# Patient Record
Sex: Female | Born: 1935 | Race: White | Hispanic: No | Smoking: Never smoker
Health system: Southern US, Community
[De-identification: ages and names within clinical notes are randomized; demographics above are authoritative.]

## PROBLEM LIST (undated history)

## (undated) DIAGNOSIS — Z5189 Encounter for other specified aftercare: Secondary | ICD-10-CM

## (undated) DIAGNOSIS — J42 Unspecified chronic bronchitis: Secondary | ICD-10-CM

## (undated) DIAGNOSIS — IMO0001 Reserved for inherently not codable concepts without codable children: Secondary | ICD-10-CM

## (undated) DIAGNOSIS — C801 Malignant (primary) neoplasm, unspecified: Secondary | ICD-10-CM

## (undated) DIAGNOSIS — J449 Chronic obstructive pulmonary disease, unspecified: Secondary | ICD-10-CM

## (undated) DIAGNOSIS — I712 Thoracic aortic aneurysm, without rupture: Secondary | ICD-10-CM

## (undated) DIAGNOSIS — M1990 Unspecified osteoarthritis, unspecified site: Secondary | ICD-10-CM

## (undated) DIAGNOSIS — I10 Essential (primary) hypertension: Secondary | ICD-10-CM

## (undated) DIAGNOSIS — N189 Chronic kidney disease, unspecified: Secondary | ICD-10-CM

## (undated) DIAGNOSIS — I7121 Aneurysm of the ascending aorta, without rupture: Secondary | ICD-10-CM

## (undated) DIAGNOSIS — I719 Aortic aneurysm of unspecified site, without rupture: Secondary | ICD-10-CM

## (undated) DIAGNOSIS — D649 Anemia, unspecified: Secondary | ICD-10-CM

## (undated) DIAGNOSIS — I729 Aneurysm of unspecified site: Secondary | ICD-10-CM

## (undated) DIAGNOSIS — K5903 Drug induced constipation: Secondary | ICD-10-CM

## (undated) HISTORY — PX: BLADDER SURGERY: SHX569

## (undated) HISTORY — PX: JOINT REPLACEMENT: SHX530

## (undated) HISTORY — PX: EYE SURGERY: SHX253

## (undated) HISTORY — DX: Encounter for other specified aftercare: Z51.89

## (undated) HISTORY — PX: TUBAL LIGATION: SHX77

## (undated) HISTORY — PX: APPENDECTOMY: SHX54

## (undated) HISTORY — PX: OTHER SURGICAL HISTORY: SHX169

## (undated) HISTORY — DX: Chronic kidney disease, unspecified: N18.9

## (undated) HISTORY — DX: Malignant (primary) neoplasm, unspecified: C80.1

## (undated) HISTORY — PX: ABDOMINAL HYSTERECTOMY: SHX81

## (undated) HISTORY — PX: MASTECTOMY: SHX3

## (undated) HISTORY — DX: Aneurysm of the ascending aorta, without rupture: I71.21

## (undated) HISTORY — PX: KNEE ARTHROSCOPY: SUR90

## (undated) HISTORY — DX: Thoracic aortic aneurysm, without rupture: I71.2

## (undated) HISTORY — PX: BREAST SURGERY: SHX581

---

## 1998-03-12 ENCOUNTER — Ambulatory Visit (HOSPITAL_COMMUNITY): Admission: RE | Admit: 1998-03-12 | Discharge: 1998-03-12 | Payer: Self-pay | Admitting: *Deleted

## 1998-03-12 ENCOUNTER — Encounter: Payer: Self-pay | Admitting: *Deleted

## 1998-03-28 ENCOUNTER — Encounter: Payer: Self-pay | Admitting: *Deleted

## 1998-03-29 ENCOUNTER — Inpatient Hospital Stay (HOSPITAL_COMMUNITY): Admission: RE | Admit: 1998-03-29 | Discharge: 1998-03-30 | Payer: Self-pay | Admitting: *Deleted

## 1999-04-06 ENCOUNTER — Encounter: Admission: RE | Admit: 1999-04-06 | Discharge: 1999-04-06 | Payer: Self-pay | Admitting: *Deleted

## 1999-04-06 ENCOUNTER — Encounter: Payer: Self-pay | Admitting: *Deleted

## 1999-07-21 ENCOUNTER — Encounter: Admission: RE | Admit: 1999-07-21 | Discharge: 1999-07-21 | Payer: Self-pay | Admitting: Hematology and Oncology

## 1999-07-21 ENCOUNTER — Encounter: Payer: Self-pay | Admitting: Hematology and Oncology

## 2000-04-11 ENCOUNTER — Encounter: Payer: Self-pay | Admitting: Hematology and Oncology

## 2000-04-11 ENCOUNTER — Encounter: Admission: RE | Admit: 2000-04-11 | Discharge: 2000-04-11 | Payer: Self-pay | Admitting: Hematology and Oncology

## 2000-08-24 ENCOUNTER — Encounter: Payer: Self-pay | Admitting: Hematology and Oncology

## 2000-08-24 ENCOUNTER — Ambulatory Visit (HOSPITAL_COMMUNITY): Admission: RE | Admit: 2000-08-24 | Discharge: 2000-08-24 | Payer: Self-pay | Admitting: Hematology and Oncology

## 2001-01-12 ENCOUNTER — Other Ambulatory Visit: Admission: RE | Admit: 2001-01-12 | Discharge: 2001-01-12 | Payer: Self-pay | Admitting: Obstetrics and Gynecology

## 2001-03-16 ENCOUNTER — Encounter: Admission: RE | Admit: 2001-03-16 | Discharge: 2001-03-16 | Payer: Self-pay | Admitting: Hematology and Oncology

## 2001-03-16 ENCOUNTER — Encounter: Payer: Self-pay | Admitting: Hematology and Oncology

## 2001-08-31 ENCOUNTER — Ambulatory Visit (HOSPITAL_COMMUNITY): Admission: RE | Admit: 2001-08-31 | Discharge: 2001-08-31 | Payer: Self-pay | Admitting: Hematology and Oncology

## 2001-08-31 ENCOUNTER — Encounter: Payer: Self-pay | Admitting: Hematology and Oncology

## 2002-03-16 ENCOUNTER — Encounter: Admission: RE | Admit: 2002-03-16 | Discharge: 2002-03-16 | Payer: Self-pay | Admitting: Obstetrics and Gynecology

## 2002-03-16 ENCOUNTER — Encounter: Payer: Self-pay | Admitting: Obstetrics and Gynecology

## 2003-03-18 ENCOUNTER — Encounter: Admission: RE | Admit: 2003-03-18 | Discharge: 2003-03-18 | Payer: Self-pay | Admitting: Oncology

## 2004-01-01 ENCOUNTER — Ambulatory Visit: Payer: Self-pay | Admitting: Family Medicine

## 2004-03-27 ENCOUNTER — Encounter: Admission: RE | Admit: 2004-03-27 | Discharge: 2004-03-27 | Payer: Self-pay | Admitting: Oncology

## 2004-05-08 ENCOUNTER — Ambulatory Visit: Payer: Self-pay | Admitting: Oncology

## 2004-07-02 ENCOUNTER — Ambulatory Visit: Payer: Self-pay | Admitting: Family Medicine

## 2004-07-15 ENCOUNTER — Encounter (INDEPENDENT_AMBULATORY_CARE_PROVIDER_SITE_OTHER): Payer: Self-pay | Admitting: Specialist

## 2004-07-15 ENCOUNTER — Inpatient Hospital Stay (HOSPITAL_COMMUNITY): Admission: RE | Admit: 2004-07-15 | Discharge: 2004-07-17 | Payer: Self-pay | Admitting: Obstetrics and Gynecology

## 2004-10-07 ENCOUNTER — Ambulatory Visit: Payer: Self-pay | Admitting: Family Medicine

## 2004-11-05 ENCOUNTER — Ambulatory Visit: Payer: Self-pay | Admitting: Family Medicine

## 2005-02-08 ENCOUNTER — Ambulatory Visit: Payer: Self-pay | Admitting: Family Medicine

## 2005-03-29 ENCOUNTER — Encounter: Admission: RE | Admit: 2005-03-29 | Discharge: 2005-03-29 | Payer: Self-pay | Admitting: Oncology

## 2005-05-28 ENCOUNTER — Ambulatory Visit: Payer: Self-pay | Admitting: Oncology

## 2006-01-05 ENCOUNTER — Ambulatory Visit: Payer: Self-pay | Admitting: Family Medicine

## 2006-03-31 ENCOUNTER — Encounter: Admission: RE | Admit: 2006-03-31 | Discharge: 2006-03-31 | Payer: Self-pay | Admitting: Oncology

## 2006-05-18 ENCOUNTER — Ambulatory Visit: Payer: Self-pay | Admitting: Oncology

## 2007-04-18 ENCOUNTER — Encounter: Admission: RE | Admit: 2007-04-18 | Discharge: 2007-04-18 | Payer: Self-pay | Admitting: Obstetrics and Gynecology

## 2008-04-18 ENCOUNTER — Encounter: Admission: RE | Admit: 2008-04-18 | Discharge: 2008-04-18 | Payer: Self-pay | Admitting: Obstetrics and Gynecology

## 2009-04-21 ENCOUNTER — Ambulatory Visit (HOSPITAL_COMMUNITY): Admission: RE | Admit: 2009-04-21 | Discharge: 2009-04-21 | Payer: Self-pay | Admitting: Obstetrics and Gynecology

## 2009-05-26 ENCOUNTER — Encounter: Payer: Self-pay | Admitting: Pulmonary Disease

## 2009-07-08 DIAGNOSIS — I10 Essential (primary) hypertension: Secondary | ICD-10-CM | POA: Insufficient documentation

## 2009-07-09 ENCOUNTER — Ambulatory Visit: Payer: Self-pay | Admitting: Pulmonary Disease

## 2009-07-09 DIAGNOSIS — C50919 Malignant neoplasm of unspecified site of unspecified female breast: Secondary | ICD-10-CM | POA: Insufficient documentation

## 2009-07-09 DIAGNOSIS — J479 Bronchiectasis, uncomplicated: Secondary | ICD-10-CM

## 2009-07-09 DIAGNOSIS — R042 Hemoptysis: Secondary | ICD-10-CM | POA: Insufficient documentation

## 2009-07-09 DIAGNOSIS — E785 Hyperlipidemia, unspecified: Secondary | ICD-10-CM

## 2009-07-09 DIAGNOSIS — E782 Mixed hyperlipidemia: Secondary | ICD-10-CM | POA: Insufficient documentation

## 2010-01-01 ENCOUNTER — Ambulatory Visit (HOSPITAL_COMMUNITY)
Admission: RE | Admit: 2010-01-01 | Discharge: 2010-01-01 | Payer: Self-pay | Source: Home / Self Care | Admitting: Ophthalmology

## 2010-01-09 ENCOUNTER — Encounter: Payer: Self-pay | Admitting: Pulmonary Disease

## 2010-01-19 ENCOUNTER — Encounter: Payer: Self-pay | Admitting: Pulmonary Disease

## 2010-01-20 ENCOUNTER — Telehealth (INDEPENDENT_AMBULATORY_CARE_PROVIDER_SITE_OTHER): Payer: Self-pay | Admitting: *Deleted

## 2010-01-21 ENCOUNTER — Ambulatory Visit (HOSPITAL_COMMUNITY)
Admission: RE | Admit: 2010-01-21 | Discharge: 2010-01-21 | Payer: Self-pay | Source: Home / Self Care | Attending: Ophthalmology | Admitting: Ophthalmology

## 2010-01-27 ENCOUNTER — Ambulatory Visit: Payer: Self-pay | Admitting: Surgery

## 2010-03-03 NOTE — Assessment & Plan Note (Signed)
Summary: consult for bronchiectasis and hemoptysis   Copy to:  Joette Catching Primary Provider/Referring Provider:  Lysbeth Galas  CC:  Pulmonary Consult.  History of Present Illness: the pt is a 76y/o female who I have been asked to see for bronchiectasis and abnormal ct chest.  She was in her usual state of health, with no pulmonary issues, until the end of April when she began to have cough with scant hemoptysis.  It was in streaks, and bright red in color.  She estimates the total quantity of blood to be approx. one tablespoon.  She was admitted to the hospital for observation, and a CT scan of her chest showed right middle lobe bronchiectasis and to a milder degree in the lingula and both lower lobes. She was also noted to have a focal opacity in the right middle lobe of unknown significance. The patient was treated with an antibiotic, and had resolution of her cough and hemoptysis. She currently has no pulmonary issues. The patient has no history of recurrent sections, and denies significant sinus problems. She denies any weight loss or anorexia.   Current Medications (verified): 1)  Vytorin 10-20 Mg Tabs (Ezetimibe-Simvastatin) .... Take 1 Tablet By Mouth Once A Day 2)  Avapro 150 Mg Tabs (Irbesartan) .... Take 1 Tablet By Mouth Once A Day 3)  Ambien 10 Mg Tabs (Zolpidem Tartrate) .... Take 1/2 Tab By Mouth At Bedtime  Allergies (verified): No Known Drug Allergies  Past History:  Past Medical History:  BREAST CANCER (ICD-174.9)--no recurrence on f/u HYPERLIPIDEMIA (ICD-272.4) HYPERTENSION (ICD-401.9)    Past Surgical History: hysterectomy 2005 tubal ligation 1968 masectomy R breast 2000 appendectomy 1968  Family History: Reviewed history from 07/08/2009 and no changes required. mother with hypertension father with chf  Social History: Reviewed history from 07/08/2009 and no changes required. pt is widowed and lives alone. pt has never smoked. pt has children. pt is retired  from [employer].  Review of Systems       The patient complains of coughing up blood and joint stiffness or pain.  The patient denies shortness of breath with activity, shortness of breath at rest, productive cough, non-productive cough, chest pain, irregular heartbeats, acid heartburn, indigestion, loss of appetite, weight change, abdominal pain, difficulty swallowing, sore throat, tooth/dental problems, headaches, nasal congestion/difficulty breathing through nose, sneezing, itching, ear ache, anxiety, depression, hand/feet swelling, rash, change in color of mucus, and fever.    Vital Signs:  Patient profile:   76 year old female Height:      67 inches Weight:      174 pounds BMI:     27.35 O2 Sat:      94 % on Room air Temp:     98.1 degrees F oral Pulse rate:   91 / minute BP sitting:   158 / 92  (left arm) Cuff size:   regular  Vitals Entered By: Arman Filter LPN (July 09, 1608 1:44 PM)  O2 Flow:  Room air CC: Pulmonary Consult Comments Medications reviewed with patient Arman Filter LPN  July 10, 9602 1:48 PM    Physical Exam  General:  ow female in nad Eyes:  PERRLA and EOMI.   Nose:  patent without discharge.  No bleeding source noted. Mouth:  clear Neck:  no jvd, tmg, LN Lungs:  totally clear to auscultation Heart:  rrr,  no mrg Abdomen:  soft and nontender, bs+ Extremities:  mild edema, pulses intact distally no cyanosis noted. Neurologic:  alert and oriented,  moves all 4.   Impression & Recommendations:  Problem # 1:  BRONCHIECTASIS (ICD-494.0) the pt had a recent episode of hemoptysis that I suspect is related to the finding of bronchiectasis on ct chest.  It is very common for this to occur when pt's develop a localized infection/inflammation in the area of bronchiectasis.  She has responded well to the course of abx.  Her ct chest also shows an abnormal density in RML near her area of bronchiectasis that I suspect is scarring and mucus  plugging/bronchial casts.  This will need f/u however, given her history of breast cancer.  She was asymptomatic prior to her episode of hemoptysis from a pulmonary standpoint, and therefore I suspect this will not be an ongoing issue for her.  She is to call me if there is recurrence of her hemoptysis  Medications Added to Medication List This Visit: 1)  Ambien 10 Mg Tabs (Zolpidem tartrate) .... Take 1/2 tab by mouth at bedtime  Other Orders: Consultation Level IV (51884) Radiology Referral (Radiology)  Patient Instructions: 1)  will check a ct chest in 6mos to followup your abnormality noted on most recent scan.  I will call you with results. 2)  please call if any issues with blood in mucus

## 2010-03-05 NOTE — Progress Notes (Signed)
Summary: returned call  re ct results  Phone Note Call from Patient Call back at Home Phone [phone]   Caller: Patient Call For: clance Complaint: Chest Pain Summary of Call: pt returned call from megan yesterday. pt asks that nurse call her back asap as she has to leave the house soon and won't be back in.  Initial call taken by: Tivis Ringer, CNA,  January 20, 2010 9:27 AM  Follow-up for Phone Call        as this is regards CT results, will forward to [facility]. Boone Master CNA/MA  January 20, 2010 11:06 AM   LMOMTCBX1.  Aundra Millet Reynolds LPN  January 22, 2010 11:41 AM    pt called back and is aware of ct results.  see clinical list update from 01-19-2010 for complete details.  Aundra Millet Reynolds LPN  January 22, 2010 11:51 AM

## 2010-03-05 NOTE — Miscellaneous (Signed)
  Clinical Lists Changes  followup ct chest shows no change in pulmonary parenchymal findings. there was a slight increase in size of aortic aneurysm....will send copy to Nyland..he will need to follow this.  Appended Document:  please let pt know her lung findings are unchanged on f/u ct chest.  It did show some very slight increase in her aneurysm, and will need followup.  let her know that we will fax this to dr nyland.  I would like to see her in 6mos here in office.  Appended Document:  [id number]  Appended Document:  [id number]  Appended Document:  pt called back.  informed her of ct results.  pt verbalized understanding but was upset as this is the first she has heard about an aneurysm and wanted to discuss this with me in further detail.  i informed pt that we have faxed her ct results to Dr. Joyce Copa office and recommended she call there to discuss this.  pt verbalized her understanding and agreed to do this.  Also informed pt to schedule 6 month f/u appt with KC.

## 2010-03-24 ENCOUNTER — Other Ambulatory Visit: Payer: Self-pay | Admitting: Obstetrics and Gynecology

## 2010-04-14 LAB — BASIC METABOLIC PANEL
CO2: 23 mEq/L (ref 19–32)
Calcium: 9.4 mg/dL (ref 8.4–10.5)
Chloride: 98 mEq/L (ref 96–112)
Creatinine, Ser: 0.68 mg/dL (ref 0.4–1.2)
GFR calc Af Amer: >60 mL/min (ref >60)
Potassium: 4.1 mEq/L (ref 3.5–5.1)
Sodium: 132 mEq/L — ABNORMAL LOW (ref 135–145)

## 2010-04-14 LAB — HEMOGLOBIN AND HEMATOCRIT, BLOOD: HCT: 33.9 % — ABNORMAL LOW (ref 36.0–46.0)

## 2010-05-27 ENCOUNTER — Encounter: Payer: Self-pay | Admitting: Pulmonary Disease

## 2010-06-02 ENCOUNTER — Ambulatory Visit: Payer: Self-pay | Admitting: Cardiology

## 2010-06-03 ENCOUNTER — Other Ambulatory Visit: Payer: Self-pay | Admitting: Surgery

## 2010-06-03 DIAGNOSIS — I712 Thoracic aortic aneurysm, without rupture: Secondary | ICD-10-CM

## 2010-06-16 NOTE — Consult Note (Signed)
NEW PATIENT CONSULTATION   Kelli Rodriguez, Kelli Rodriguez  DOB:  1934/08/23                                        January 27, 2010  CHART #:  [id number]   REASON FOR CONSULTATION:  Ascending aortic aneurysm.   CLINICAL HISTORY:  I was asked by Dr. Lysbeth Galas to evaluate the patient for  an ascending aortic aneurysm.  She is a 76 year old active woman who was  admitted in April 2011, with brief episode of hemoptysis and cough.  She  had a CT scan of the chest at that time that showed bronchiectasis and  she was treated with antibiotics and bronchodilators with resolution of  her symptoms.  She was subsequently seen in followup by Dr. Marcelyn Bruins, Pulmonary Medicine in June 2011, and then again in early  December 2011, and had a followup CT scan of the chest performed on  January 09, 2010.  This showed a stable appearance of the bronchiectasis  and the surrounding soft tissue opacity which may represent scarring.  The radiologist commented on a dilated ascending aorta with a maximum  diameter of 4.2 x 4.2 cm.  This was compared to the previous CT scan in  April 2011, where the diameter of the ascending aorta was measured at 4  x 3.9 cm.  At that time in April, apparently no comment was made about  the aorta.  The patient was notified that there was enlargement of her  ascending aorta and she was scheduled for an appointment with me.   REVIEW OF SYSTEMS:  Her review of systems is as follows.  GENERAL:  She denies any fever or chills.  She has had no weight  changes.  She denies fatigue.  EYES:  She has a history of cataracts and underwent cataract surgery on  both eyes in December 2011.  ENT:  Negative.  ENDOCRINE:  She denies diabetes and hypothyroidism.  CARDIOVASCULAR:  She denies any chest pain or pressure.  She denies  shortness of breath.  She has had no PND or orthopnea.  She does report  some palpitations.  RESPIRATORY:  She denies cough and sputum production at  this time.  She  has had no further hemoptysis since the episode in April.  GI:  She denies nausea or vomiting.  She denies melena and bright red  blood per rectum.  GU:  She denies dysuria and hematuria.  MUSCULOSKELETAL:  She denies arthralgias and myalgias.  NEUROLOGICAL:  She denies any focal weakness or numbness.  She denies  dizziness and syncope.  She has never had a TIA or stroke.  VASCULAR:  She denies claudication or phlebitis.  PSYCHIATRIC:  She does report some nervousness.  HEMATOLOGICAL:  Negative.   PAST MEDICAL HISTORY:  Significant for breast cancer status post right  mastectomy and subsequent breast reconstruction with an implant.  This  was performed in 2000.  She is status post hysterectomy in 2005 and  tubal ligation in 1968.  She had an appendectomy in 1968.  She has a  history of hypertension and hyperlipidemia.   SOCIAL HISTORY:  She is widowed and lives alone.  She has 5 adult  children.  She is retired.  She has never smoked.  Denies alcohol use.  She is very active and goes to gym several times per week.   FAMILY HISTORY:  She does report a family history of congestive heart  failure in her father, but he was in his 48s.  She had a brother, died  suddenly in his 47s of unknown cause.  There is no history of known  aneurysm disease.   PHYSICAL EXAMINATION:  Vital Signs:  Her blood pressure is 150/80, pulse  96 and regular, and respiratory rate is 16 and unlabored.  Oxygen  saturation on room air is 96%.  General:  She is a well-developed white  female, in no distress.  HEENT:  Normocephalic and atraumatic.  Pupils  are equal and reactive to light and accommodation.  Extraocular muscles  are intact.  Throat is clear.  Neck:  Normal carotid pulses bilaterally.  There are no bruits.  There is no adenopathy or thyromegaly.  Cardiac:  Regular rate and rhythm with normal S1 and S2.  There is no murmur, rub,  or gallop.  Lungs:  Clear.  Abdomen:  Active bowel  sounds.  Abdomen is  soft and nontender.  There are no palpable masses or organomegaly.  There is a right breast mastectomy scar with a breast implant in place.  Extremities:  No peripheral edema.  Pedal pulses are palpable  bilaterally.  Skin:  Warm and dry.  Neurologic:  Alert and oriented x3.  Motor and sensory exams grossly normal.   IMPRESSION:  The patient has a 4.2-cm fusiform ascending aortic  aneurysm.  I reviewed her recent CT scan as well as the CT scan in April  2011, and I do not think there has been any significant change in the  size of the aneurysm.  The 2 mm difference in measurement may be due to  the level of which the aorta was measured and the orientation of the  aorta to the scanner.  I do not think this warrants any surgical  treatment at this time and I would recommend continuing to follow her  aneurysm with an MR angiogram in 6 months.  I think this would be a  better study to follow her aneurysm to decrease her radiation exposure.  I reviewed the pathophysiology of aortic aneurysm with her and her son  and the rationale for not recommending surgical treatment unless we see  rapid enlargement or it reaches 5.5 cm in diameter.  Also, I discussed  the importance of being on a beta-blocker to decrease the force of  contraction on the aorta and has started her on Toprol-XL 25 mg daily at  this time.  The dose of this may be titrated upwards depending on her  blood pressure and heart rate response.  She is already on Diovan and  HCTZ for hypertension.  She seems much relieved to know that she does  not require surgical treatment for this aneurysm and that we will be  closely following it.  I told her that we do not know how long the aorta  has been in this size and cannot predict whether her aorta will  progressively enlarge over time.  She seems to understand all this and  asked the appropriate questions and I think she will be very compliant  with followup.  She  will continue to follow up with Dr. Lysbeth Galas for her  medical care and he can decide if she will tolerate a higher dose of  beta-blocker.  I will plan to see her back in 6 months with an MR  angiogram of the aorta at that time.   Evelene Croon, M.D.  Electronically Signed   BB/MEDQ  D:  01/27/2010  T:  01/28/2010  Job:  [id number]   cc:   Delaney Meigs, M.D.  Barbaraann Share, MD,FCCP

## 2010-06-18 ENCOUNTER — Ambulatory Visit: Payer: Self-pay | Admitting: Cardiology

## 2010-06-19 NOTE — Op Note (Signed)
NAMEBROOKLIN, Kelli Rodriguez NO.:  [id number]   MEDICAL RECORD NO.:  [id number]          PATIENT TYPE:  INP   LOCATION:  0002                         FACILITY:  [facility]   PHYSICIAN:  Malachi Pro. Ambrose Mantle, M.D. DATE OF BIRTH:  24-Apr-1934   DATE OF PROCEDURE:  07/15/2004  DATE OF DISCHARGE:                                 OPERATIVE REPORT   PREOPERATIVE DIAGNOSES:  Third-degree cystocele, second-degree rectocele.   POSTOPERATIVE DIAGNOSES:  Third-degree cystocele, second-degree rectocele.   OPERATION:  Vaginal hysterectomy, A and P repair.   OPERATOR:  Malachi Pro. Ambrose Mantle, M.D.   ASSISTANT:  Huel Cote, M.D.   ANESTHESIA:  General anesthesia.   DESCRIPTION OF PROCEDURE:  The patient was brought to the operating room and  placed under satisfactory general anesthesia and placed in lithotomy  position. Exam revealed the already known adherence of the cervix to the  posterior vaginal wall obliterating the posterior fornix. There was a large  cystocele and smaller rectocele. The adnexa were free of masses. I could not  feel the uterus definitely. The vulva, vagina, perineum and urethra were  prepped with Betadine solution and draped as a sterile field. The cervix was  exposed with a weighted speculum and Deaver retractors and the cervix was  drawn into the operative field with two Lahey clamps. The cervicovaginal  junction was injected with a dilute solution of Neo-Synephrine and a  circumferential incision was made around the cervix with a knife. The  bladder was pushed anteriorly, the posterior cul-de-sac was identified and  the first injury did not enter the peritoneal cavity, but subsequent entry  did. I then clamped, cut and suture ligated the uterosacral ligaments and  held them. The cardinal ligaments were clamped, cut and suture ligated, the  parametrial tissues were clamped, cut, suture ligated. The anterior  peritoneum was identified and entered. The bladder was  retracted away, the  uterus was inverted through the incision in the cul-de-sac. The upper  pedicles were clamped across, cut, the uterus was removed and the upper  pedicles were doubly suture ligated. Bleeding from the posterior vaginal  cuff was controlled with a couple of interrupted figure-of-eight sutures of  #0 Vicryl. Hemostasis appeared completely adequate. The peritoneum was  closed with a running suture of #1 Vicryl beginning on the anterior  peritoneum incorporating the left upper pedicle, the left uterosacral  ligament, the posterior peritoneum, the right uterosacral ligament and the  right upper pedicle. After hemostasis was again deemed to be complete, the  pursestring suture was tied down. The uterosacral ligaments were then  sutured together extraperitoneally.  The anterior vaginal mucosa was then  separated from the cystocele all the way to about a centimeter from the  urethral meatus. The cystocele was developed. Bleeders were coagulated,  cystocele was obliterated with interrupted simple sutures of #0 Vicryl. A  large portion of redundant vaginal mucosa was cut away, the anterior vaginal  mucosa was then reunited in the midline with interrupted figure-of-eight  sutures of #0 Vicryl. The posterior fourchette was cut across, the posterior  vaginal mucosa was undermined close to the vaginal  cuff. The rectocele was  developed. I did not place any sutures strictly through the rectocele. I cut  away redundant vaginal mucosa and reapproximated the vaginal mucosa in the  midline using interrupted figure-of-eight sutures of  #0 Vicryl. At this  point, the entire suture line was inspected, found to be  hemostatic and a 2 inch Iodoform pack was placed into the vagina and the  procedure was terminated. Blood loss was estimated at 200 mL and the patient  was returned to recovery in satisfactory condition. After the hysterectomy  had been complete, I did infuse the bladder with  methylene blue stained  saline,  there was no leakage.       TFH/MEDQ  D:  07/15/2004  T:  07/15/2004  Job:  [id number]

## 2010-06-19 NOTE — H&P (Signed)
Kelli Rodriguez, HARBACH NO.:  [id number]   MEDICAL RECORD NO.:  [id number]          PATIENT TYPE:  INP   LOCATION:  NA                           FACILITY:  [facility]   PHYSICIAN:  Malachi Pro. Ambrose Mantle, M.D. DATE OF BIRTH:  November 05, 1934   DATE OF ADMISSION:  DATE OF DISCHARGE:                                HISTORY & PHYSICAL   HISTORY OF PRESENT ILLNESS:  This is a 76 year old white married female,  para 4, 0-0-5, admitted to the hospital for vaginal hysterectomy and A&P  repair because of a large cystocele and rectocele.  This patient reported in  May, 2006 that she had felt something out of place in her vagina for a  couple of months.  She denied stress urinary incontinence.  She was noted to  have a 3-4 degree cystocele.  There was a smaller rectocele.  The cervix was  adherent to the posterior fornix, preventing descent of the uterus.  The  patient was asked to consider observation of pessary or surgery.  She  elected to proceed with surgery and is admitted now for vaginal  hysterectomy, A&P repair.   PAST MEDICAL HISTORY:  1.  High blood pressure.  2.  Right breast cancer.  3.  Arthritis.   ALLERGIES:  No known allergies.   PAST SURGICAL HISTORY:  1.  Right mastectomy.  2.  Right knee surgery.  3.  Tubal ligation.  4.  Breast implant on the right.   MEDICATIONS:  1.  Vytorin 10/20.  2.  Avalide 150/12.5.   The patient does not drink and does not smoke.   She has a negative review of systems.  Denies heart problems.   Mother died at 86 of a stroke and Alzheimer's disease.  Father died at 66 of  heart disease.  Four sisters, apparently living and well.  Four deceased  brothers, three died with heart problems.  Two living brothers.   PHYSICAL EXAMINATION:  VITAL SIGNS:  Weight is 170-1/2 pounds.  Blood  pressure 142/94.  Pulse 80.  GENERAL:  Patient is a well-developed and well-nourished white female in no  distress.  HEENT:  No cranial abnormalities.   Extraocular movements are intact.  Nose  and pharynx are clear.  There are upper and lower dental plates.  NECK:  Supple without thyromegaly.  BREASTS:  A right breast prosthesis.  The left breast is negative.  LUNGS:  Clear.  HEART:  Normal size and sounds.  No murmurs.  ABDOMEN:  Soft.  Nontender.  No masses are palpable.  The liver, spleen, and  kidneys are not felt.  PELVIC:  Patient had a normal Pap smear within the last month.  Vulva and  vagina are clean.  There is a third-degree cystocele, second-third degree  rectocele.  The cervix does not prolapse because the posterior fornix is  obliterated by scarring of the posterior cervix to the posterior vagina.  The uterus is hard to feel.  The adnexa are clear.  RECTAL:  Confirms the above findings.   ADMITTING IMPRESSION:  Symptomatic cystocele and rectocele.   The patient is ready for  vaginal hysterectomy, A&P repair.  She has been  informed that the scarring in the posterior vagina does increase the  likelihood of injury to her rectum.  She has also been informed of the risks  of surgery, including but not limited to heart attack, stroke, pulmonary  embolus, wound disruption, hemorrhage with need for reoperation and/or  transfusion, fistula formation, nerve injury, and intestinal obstruction.  She understands and agrees to proceed.  The patient is sexually active only  once or twice a year.  She has been informed that the surgery will shorten  and narrow her vagina.       TFH/MEDQ  D:  07/14/2004  T:  07/14/2004  Job:  [id number]

## 2010-06-19 NOTE — Discharge Summary (Signed)
NAMECYLAH, FANNIN NO.:  [id number]   MEDICAL RECORD NO.:  [id number]          PATIENT TYPE:  INP   LOCATION:  1613                         [facility]ACILITY:  [facility]   PHYSICIAN:  Malachi Pro. Ambrose Mantle, M.D. DATE OF BIRTH:  Oct 15, 1934   DATE OF ADMISSION:  07/15/2004  DATE OF DISCHARGE:                                 DISCHARGE SUMMARY   HOSPITAL COURSE:  This is a 76 year old white female admitted for vaginal  hysterectomy, A&P repair, because of symptomatic pelvic relaxation with  third-degree cystocele and second-degree rectocele. The patient underwent a  vaginal hysterectomy, A&P repair by Dr. Ambrose Mantle with Dr. Senaida Ores assisting  under general anesthesia with blood loss of about 200 cc. Postoperatively,  the patient did well. Her pack was removed on the first postoperative day.  She progressed well, tolerating a diet, ambulating well without difficulty,  voiding well, and feeling as if she emptied her bladder in amounts up to 400  mL, and on the second postoperative day was ready for discharge.   LABORATORY DATA:  Admission hemoglobin 12.2; hematocrit 35.8; white count  4900; platelet count 328,000. Follow-up hematocrits 32.5 and 31.0, 49 segs,  42 lymph, 6 monos, 3 eosinophils, and 1 basophil. Comprehensive metabolic  profile in a nonfasting state was normal except for glucose of 125 and  sodium of 133. Urinalysis was negative except for 3-6 white cells in a  voided specimen. Chest x-ray showed no active disease. EKG showed normal  sinus rhythm, possible anterior infarct age undetermined, no old tracings to  compare.   FINAL DIAGNOSES:  With the path report showing inactive endometrium and no  cervical dysplasia was third-degree cystocele, second-degree rectocele.   OPERATION:  Vaginal hysterectomy, A&P repair.   FINAL CONDITION:  Improved.   Instructions include our regular discharge instructions. No vaginal  entrance, no heavy lifting or strenuous activity, call  with any fever  greater than 100.4 degrees, call with any unusual problems. Return to the  office in 2 weeks for follow-up examination. Percocet 5/325 #20 tablets one  every 4-6 hours as needed for pain is given at discharge.       TFH/MEDQ  D:  07/17/2004  T:  07/17/2004  Job:  [id number]

## 2010-08-03 ENCOUNTER — Ambulatory Visit
Admission: RE | Admit: 2010-08-03 | Discharge: 2010-08-03 | Disposition: A | Discharge status: Home / Self Care | Payer: Medicare HMO | Source: Ambulatory Visit | Attending: Surgery | Admitting: Surgery

## 2010-08-03 DIAGNOSIS — I712 Thoracic aortic aneurysm, without rupture: Secondary | ICD-10-CM

## 2010-08-03 MED ORDER — GADOBENATE DIMEGLUMINE 529 MG/ML IV SOLN
16.0000 mL | Freq: Once | INTRAVENOUS | Status: AC | PRN
  Administered 2010-08-03: 16 mL via INTRAVENOUS

## 2010-08-04 ENCOUNTER — Encounter (INDEPENDENT_AMBULATORY_CARE_PROVIDER_SITE_OTHER): Payer: Medicare HMO | Admitting: Surgery

## 2010-08-04 DIAGNOSIS — I712 Thoracic aortic aneurysm, without rupture: Secondary | ICD-10-CM

## 2010-08-04 NOTE — Assessment & Plan Note (Addendum)
OFFICE VISIT  Kelli Rodriguez, Kelli Rodriguez DOB:  02-13-1934                                        August 04, 2010 CHART #:  [id number]  The patient returned to my office today for followup of an ascending aortic aneurysm.  I last saw her January 27, 2010, at which time CT scan is showing a stable 4.2-cm fusiform ascending aortic aneurysm.  Her initial CT scan from April 2011, has showed a diameter of 4 x 3.9 cm, but I do not think there is any significant change on the scan in December 2011.  She has continued to feel well and remains active.  She denies any chest or back pain.  She denies any exertional dyspnea.  Her blood pressure is elevated in the office today, but she said she checks her blood pressure at home several times a week and it is usually running around 105 systolic.  PHYSICAL EXAMINATION:  Vital Signs:  Today, her blood pressure is 172/79, pulse is 77 and regular, respiratory rate is 20 and unlabored. Oxygen saturation on room air is 94%.  General:  She looks well. Cardiac:  Regular rate and rhythm with normal heart sounds.  Lungs: Clear.  MR angiogram of the chest today shows the maximum diameter of her fusiform ascending aortic aneurysm to be 4.4-cm.  There is no sign of aortic dissection.  IMPRESSION:  The patient continues to do well clinically.  Her MR angiogram today shows a 4.4-cm fusiform ascending aortic aneurysm which is slightly larger than her previous CT scan December 2011, but probably within the margin of air.  Her blood pressure is up today, but she said that she was nervous about coming to the doctor.  I did write her another prescription today for her Lopressor 25 mg daily.  She said she has been taking all of her medications as prescribed. I recommended that we do a followup MR angiogram in 1 year, and I will see her back at that time.  Evelene Croon, M.D. Electronically Signed  BB/MEDQ  D:  08/04/2010  T:  08/04/2010  Job:   [id number]  cc:   Delaney Meigs, M.D.

## 2010-09-10 ENCOUNTER — Other Ambulatory Visit: Payer: Self-pay | Admitting: Family Medicine

## 2010-09-10 DIAGNOSIS — M8580 Other specified disorders of bone density and structure, unspecified site: Secondary | ICD-10-CM

## 2010-09-10 DIAGNOSIS — E2839 Other primary ovarian failure: Secondary | ICD-10-CM

## 2010-09-10 DIAGNOSIS — M949 Disorder of cartilage, unspecified: Secondary | ICD-10-CM

## 2010-09-16 ENCOUNTER — Other Ambulatory Visit: Payer: Medicare HMO

## 2010-09-22 ENCOUNTER — Ambulatory Visit
Admission: RE | Admit: 2010-09-22 | Discharge: 2010-09-22 | Disposition: A | Discharge status: Home / Self Care | Payer: Medicare HMO | Source: Ambulatory Visit | Attending: Family Medicine | Admitting: Family Medicine

## 2010-09-22 DIAGNOSIS — M8580 Other specified disorders of bone density and structure, unspecified site: Secondary | ICD-10-CM

## 2010-09-22 DIAGNOSIS — E2839 Other primary ovarian failure: Secondary | ICD-10-CM

## 2011-04-26 ENCOUNTER — Other Ambulatory Visit (HOSPITAL_COMMUNITY): Payer: Self-pay | Admitting: Family Medicine

## 2011-04-26 DIAGNOSIS — Z139 Encounter for screening, unspecified: Secondary | ICD-10-CM

## 2011-05-03 ENCOUNTER — Ambulatory Visit (HOSPITAL_COMMUNITY)
Admission: RE | Admit: 2011-05-03 | Discharge: 2011-05-03 | Disposition: A | Discharge status: Home / Self Care | Payer: Medicare Other | Source: Ambulatory Visit | Attending: Family Medicine | Admitting: Family Medicine

## 2011-05-03 ENCOUNTER — Ambulatory Visit (HOSPITAL_COMMUNITY): Payer: Medicare HMO

## 2011-05-03 ENCOUNTER — Other Ambulatory Visit (HOSPITAL_COMMUNITY): Payer: Self-pay | Admitting: Family Medicine

## 2011-05-03 DIAGNOSIS — Z978 Presence of other specified devices: Secondary | ICD-10-CM | POA: Insufficient documentation

## 2011-05-03 DIAGNOSIS — Z139 Encounter for screening, unspecified: Secondary | ICD-10-CM

## 2011-05-03 DIAGNOSIS — Z1231 Encounter for screening mammogram for malignant neoplasm of breast: Secondary | ICD-10-CM | POA: Insufficient documentation

## 2011-07-26 ENCOUNTER — Other Ambulatory Visit: Payer: Self-pay | Admitting: Surgery

## 2011-07-26 DIAGNOSIS — I712 Thoracic aortic aneurysm, without rupture: Secondary | ICD-10-CM

## 2011-08-02 ENCOUNTER — Other Ambulatory Visit: Payer: Self-pay | Admitting: Surgery

## 2011-08-02 DIAGNOSIS — I712 Thoracic aortic aneurysm, without rupture: Secondary | ICD-10-CM

## 2011-08-24 ENCOUNTER — Ambulatory Visit (INDEPENDENT_AMBULATORY_CARE_PROVIDER_SITE_OTHER): Payer: Medicare Other | Admitting: Surgery

## 2011-08-24 ENCOUNTER — Encounter: Payer: Self-pay | Admitting: Surgery

## 2011-08-24 ENCOUNTER — Ambulatory Visit
Admission: RE | Admit: 2011-08-24 | Discharge: 2011-08-24 | Disposition: A | Discharge status: Home / Self Care | Payer: Medicare Other | Source: Ambulatory Visit | Attending: Surgery | Admitting: Surgery

## 2011-08-24 VITALS — BP 147/80 | HR 82 | Resp 16 | Ht 66.0 in | Wt 182.0 lb

## 2011-08-24 DIAGNOSIS — I712 Thoracic aortic aneurysm, without rupture: Secondary | ICD-10-CM

## 2011-08-24 MED ORDER — GADOBENATE DIMEGLUMINE 529 MG/ML IV SOLN
16.0000 mL | Freq: Once | INTRAVENOUS | Status: AC | PRN
  Administered 2011-08-24: 16 mL via INTRAVENOUS

## 2011-08-24 NOTE — Progress Notes (Signed)
[address]            [place] [address]          [phone]      HPI:  The patient returns to my office today for followup of an ascending aortic aneurysm. I last saw her on 08/04/2010 at which time MR angiogram of the chest showed the maximum diameter of her aortic aneurysm to be 4.4 cm. This was unchanged from her prior scan in December of 2011. She continues to feel well. She denies any chest or back pain. She's had no shortness of breath.  Current Outpatient Prescriptions  Medication Sig Dispense Refill  . acetaminophen (TYLENOL) 325 MG tablet Take 650 mg by mouth every 6 (six) hours as needed.      . hydrochlorothiazide (MICROZIDE) 12.5 MG capsule Take 12.5 mg by mouth daily.      Marland Kitchen LORazepam (ATIVAN) 0.5 MG tablet Take 0.5 mg by mouth at bedtime as needed.      . metoprolol tartrate (LOPRESSOR) 25 MG tablet Take 25 mg by mouth 1 day or 1 dose.      . rosuvastatin (CRESTOR) 5 MG tablet Take 5 mg by mouth daily.      . valsartan (DIOVAN) 160 MG tablet Take 160 mg by mouth daily.      Marland Kitchen aspirin 81 MG tablet Take 81 mg by mouth daily.      Marland Kitchen ezetimibe-simvastatin (VYTORIN) 10-20 MG per tablet Take 1 tablet by mouth at bedtime.       No current facility-administered medications for this visit.   Facility-Administered Medications Ordered in Other Visits  Medication Dose Route Frequency Provider Last Rate Last Dose  . gadobenate dimeglumine (MULTIHANCE) injection 16 mL  16 mL Intravenous Once PRN Medication Radiologist, MD   16 mL at 08/24/11 1208     Physical Exam: BP 147/80  Pulse 82  Resp 16  Ht 5\' 6"  (1.676 m)  Wt 182 lb (82.555 kg)  BMI 29.38 kg/m2  SpO2 94% She looks well. Cardiac exam shows regular rate and rhythm with normal heart sounds. There is no murmur, rub, or gallop. Lung exam is clear.  Diagnostic Tests:  *RADIOLOGY REPORT*   Clinical Data:  Thoracic aneurysm   MRA CHEST WITHOUT AND WITH CONTRAST   Technique:   Angiographic images of the chest were obtained using MRA technique without and with intravenous contrast.   BUN and creatinine were obtained on site at [facility] at [address] Results:  BUN nine mg/dL,  Creatinine 0.6 mg/dL.   Contrast: 16mL MULTIHANCE GADOBENATE DIMEGLUMINE 529 MG/ML IV SOLN   Comparison:  08/03/2010   Findings:  Maximal aortic diameters at the sinus of Valsalva, sinotubular junction, and ascending aorta are 3.8 cm, 3.4 cm, and 4.3 cm.  This is not significantly changed.  No evidence of aortic dissection.  Innominate artery, left common carotid artery, left subclavian artery are patent.  Right subclavian and common carotid arteries are widely patent.  Vertebral arteries are grossly patent bilaterally.   Mediastinum and lungs are grossly clear.   IMPRESSION: No significant change and mild aneurysmal dilatation of the ascending aorta, today with maximal diameter of 4.3 cm.   Original Report Authenticated By: Donavan Burnet, M.D.   Impression:  The fusiform ascending aortic aneurysm is unchanged in size. She will continue her present medications. I renewed her prescription  today for metoprolol 25 mg daily.  Plan: I will see her back in 2 years with a repeat MR angiogram of the chest.

## 2012-01-12 ENCOUNTER — Other Ambulatory Visit (HOSPITAL_COMMUNITY): Payer: Self-pay | Admitting: Orthopedic Surgery

## 2012-01-17 ENCOUNTER — Encounter (HOSPITAL_COMMUNITY): Payer: Self-pay

## 2012-01-18 ENCOUNTER — Other Ambulatory Visit (HOSPITAL_COMMUNITY): Payer: Self-pay | Admitting: Orthopedic Surgery

## 2012-01-18 ENCOUNTER — Other Ambulatory Visit: Payer: Self-pay | Admitting: Orthopedic Surgery

## 2012-01-24 ENCOUNTER — Encounter (HOSPITAL_COMMUNITY)
Admission: RE | Admit: 2012-01-24 | Discharge: 2012-01-24 | Disposition: A | Discharge status: Home / Self Care | Payer: Medicare Other | Source: Ambulatory Visit | Attending: Orthopedic Surgery | Admitting: Orthopedic Surgery

## 2012-01-24 ENCOUNTER — Ambulatory Visit (HOSPITAL_COMMUNITY)
Admission: RE | Admit: 2012-01-24 | Discharge: 2012-01-24 | Disposition: A | Discharge status: Home / Self Care | Payer: Medicare Other | Source: Ambulatory Visit | Attending: Orthopedic Surgery | Admitting: Orthopedic Surgery

## 2012-01-24 ENCOUNTER — Encounter (HOSPITAL_COMMUNITY): Payer: Self-pay

## 2012-01-24 DIAGNOSIS — Z01812 Encounter for preprocedural laboratory examination: Secondary | ICD-10-CM | POA: Insufficient documentation

## 2012-01-24 DIAGNOSIS — Z01818 Encounter for other preprocedural examination: Secondary | ICD-10-CM | POA: Insufficient documentation

## 2012-01-24 HISTORY — DX: Unspecified osteoarthritis, unspecified site: M19.90

## 2012-01-24 HISTORY — DX: Unspecified chronic bronchitis: J42

## 2012-01-24 LAB — CBC
HCT: 34.8 % — ABNORMAL LOW (ref 36.0–46.0)
Hemoglobin: 11.5 g/dL — ABNORMAL LOW (ref 12.0–15.0)
MCH: 30 pg (ref 26.0–34.0)
MCHC: 33 g/dL (ref 30.0–36.0)

## 2012-01-24 LAB — TYPE AND SCREEN: ABO/RH(D): O POS

## 2012-01-24 LAB — BASIC METABOLIC PANEL
BUN: 13 mg/dL (ref 6–23)
Creatinine, Ser: 0.77 mg/dL (ref 0.50–1.10)
GFR calc non Af Amer: 80 mL/min — ABNORMAL LOW (ref >90)
Glucose, Bld: 114 mg/dL — ABNORMAL HIGH (ref 70–99)
Potassium: 4.4 mEq/L (ref 3.5–5.1)

## 2012-01-24 LAB — URINALYSIS, ROUTINE W REFLEX MICROSCOPIC
Glucose, UA: NEGATIVE mg/dL
Hgb urine dipstick: NEGATIVE
Specific Gravity, Urine: 1.017 (ref 1.005–1.030)

## 2012-01-24 LAB — ABO/RH: ABO/RH(D): O POS

## 2012-01-24 NOTE — Pre-Procedure Instructions (Addendum)
20 Kelli Rodriguez  01/24/2012   Your procedure is scheduled on: Tuesday, December 31st   Report to [facility] at 5:30 AM.  Call this number if you have problems the morning of surgery: [phone]   Remember:   Do not eat food or drink any liquids:After Midnight Monday.    Take these medicines the morning of surgery with A SIP OF WATER: Metoprolol, Ativan             Stop takin anti=flammatories, coumadin, effient, herbal medications 4-5 days before surgery.  Do not wear jewelry, make-up or nail polish              Do not wear lotions, powders, or perfumes. You may NOT wear deodorant.   Ladies -- Do not shave 48 hours prior to surgery.   Do not bring valuables to the hospital.   Contacts, dentures or bridgework may not be worn into surgery.  Leave suitcase in the car. After surgery it may be brought to your room.  For patients admitted to the hospital, checkout time is 11:00 AM the day of discharge.   Patients discharged the day of surgery will not be allowed to drive home.   Name and phone number of your driver:    Special Instructions: Shower using CHG 2 nights before surgery and the night before surgery.  If you shower the day of surgery use CHG.  Use special wash - you have one bottle of CHG for all showers.  You should use approximately 1/3 of the bottle for each shower.   Please read over the following fact sheets that you were given: Pain Booklet, Coughing and Deep Breathing, Blood Transfusion Information, MRSA Information and Surgical Site Infection Prevention

## 2012-01-24 NOTE — Progress Notes (Addendum)
1345  Have requested any and all office notes concerning this aneurysm from Dr. Joyce Copa office &....I DID print off Dr. Sharee Pimple notes and placed in chart.... FOR COMPARISON EKG, I SPOKE WITH KATHLEEN AT DR. NYLAND'S OFFICE TO GET ONE.    1550  (They do not have any ekg for comparison).... Patient had mastectomy with node dissection on right...Marland KitchenDA  1445  CALLED DR. Diamantina Providence OFFICE TO HAVE HIM LOOK AT URINE.  STAFF (LAUREN ??) SAID HE WOULD MAKE HIM AWARE....DA

## 2012-01-25 ENCOUNTER — Encounter (HOSPITAL_COMMUNITY): Payer: Self-pay | Admitting: Vascular Surgery

## 2012-01-25 NOTE — Consult Note (Signed)
Anesthesia chart review: Patient is a 77 year old female scheduled for right total knee arthroplasty on 02/01/2012 by Dr. August Saucer. History includes descending aortic aneurysm measuring 4.3 cm by MRA 08/2011 (followed by Dr. Laneta Simmers), anxiety, chronic bronchitis, arthritis, breast cancer s/p right mastectomy with breast implant '00, nonsmoker, hysterectomy, appendectomy, and prior bladder, knee, and eye surgeries.  PCP is Dr. Lysbeth Galas. Medications include metoprolol, losartan, HCTZ, rosuvastatin, lorazepam.  BP at PAT 128/69.  EKG on 01/24/12 showed sinus rhythm with occasional PVCs, left axis deviation, poor R wave progression/cannot rule out anterior infarct (old).  It was not felt significantly changed from her EKG on 01/01/10.  Chest x-ray on 01/24/2012 showed mild hyperinflation, no acute disease.  MRA of the chest on 08/24/11 showed: No significant change and mild aneurysmal dilatation of the  ascending aorta, today with maximal diameter of 4.3 cm. This was reviewed by Dr. Laneta Simmers (see his office note from that same date).  He recommended two year follow-up with repeat MR angiogram of the chest at that time.  Pre-operative labs noted.  Her PAT RN already notified Dr. Diamantina Providence office RE: abnormal UA.  Urine culture is still pending.  She has had follow-up on her ascending aortic aneurysm within the past six months.  Her BP appears controlled, and her aneurysm and EKG appear stable.  If no significant change in her status then anticipate she can proceed as planned from an anesthesia standpoint.  Shonna Chock, PA-C 01/25/12 1218

## 2012-01-26 LAB — URINE CULTURE: Colony Count: >=100000

## 2012-01-31 MED ORDER — CHLORHEXIDINE GLUCONATE 4 % EX LIQD: 60.0000 mL | Freq: Once | CUTANEOUS | Status: DC

## 2012-01-31 MED ORDER — CEFAZOLIN SODIUM-DEXTROSE 2-3 GM-% IV SOLR
2.0000 g | INTRAVENOUS | Status: AC
  Administered 2012-02-01: 2 g via INTRAVENOUS
  Filled 2012-01-31: qty 50

## 2012-01-31 NOTE — H&P (Signed)
TOTAL KNEE ADMISSION H&P  Patient is being admitted for right total knee arthroplasty.  Subjective:  Chief Complaint:right knee pain.  HPI: Kelli Rodriguez, 77 y.o. female, has a history of pain and functional disability in the right knee due to arthritis and has failed non-surgical conservative treatments for greater than 12 weeks to includeNSAID's and/or analgesics, corticosteriod injections, flexibility and strengthening excercises, use of assistive devices and activity modification.  Onset of symptoms was gradual, starting >10 years ago with gradually worsening course since that time. The patient noted prior procedures on the knee to include  arthroscopy and menisectomy on the right knee(s).  Patient currently rates pain in the right knee(s) at 8 out of 10 with activity. Patient has night pain, worsening of pain with activity and weight bearing, pain that interferes with activities of daily living, crepitus and joint swelling.  Patient has evidence of subchondral cysts, subchondral sclerosis, periarticular osteophytes and joint space narrowing by imaging studies. This patient has had a long and arduous history of pain in the knee.. There is no active infection.  Patient Active Problem List   Diagnosis Date Noted  . Ascending aortic aneurysm 08/24/2011  . BREAST CANCER 07/09/2009  . HYPERLIPIDEMIA 07/09/2009  . BRONCHIECTASIS 07/09/2009  . HEMOPTYSIS UNSPECIFIED 07/09/2009  . HYPERTENSION 07/08/2009   Past Medical History  Diagnosis Date  . Bronchitis, chronic   . Arthritis     knees and hands  . Anxiety   . Ascending aortic aneurysm     followed by Dr. Laneta Simmers, 4.3 cm 08/2011 by MRA  . Cancer     right breast cancer s/p mastectomy '00    Past Surgical History  Procedure Date  . Tubal ligation     1968  . Knee arthroscopy     right  . Mastectomy     right    2002  . Right breast implant   . Abdominal hysterectomy   . Bladder surgery     tacked  . Eye surgery    bilateral--lens implant left  . Breast surgery   . Appendectomy     No prescriptions prior to admission   No Known Allergies  History  Substance Use Topics  . Smoking status: Never Smoker   . Smokeless tobacco: Never Used  . Alcohol Use: No    No family history on file.   Review of Systems  Constitutional: Negative.   HENT: Negative.   Eyes: Negative.   Respiratory: Negative.   Cardiovascular: Negative.   Gastrointestinal: Negative.   Genitourinary: Negative.   Musculoskeletal: Positive for joint pain.  Skin: Negative.   Neurological: Negative.   Endo/Heme/Allergies: Negative.   Psychiatric/Behavioral: Negative.     Objective:  Physical Exam  Constitutional: She appears well-developed.  HENT:  Head: Normocephalic.  Eyes: Pupils are equal, round, and reactive to light.  Neck: Normal range of motion.  Cardiovascular: Normal rate.   Respiratory: Effort normal.  GI: Soft.  right knee slight varus dp 2 / 4 - rom 15 to 90 - collaterals stable  Vital signs in last 24 hours:    Labs:   Estimated Body mass index is 29.38 kg/(m^2) as calculated from the following:   Height as of 08/24/11: 5\' 6" (1.676 m).   Weight as of 08/24/11: 182 lb(82.555 kg).   Imaging Review Plain radiographs demonstrate severe degenerative joint disease of the bilaterally knee(s). The overall alignment ismild varus. The bone quality appears to be good for age and reported activity level.  Assessment/Plan:  End  stage arthritis, bilaterally knee   The patient history, physical examination, clinical judgment of the provider and imaging studies are consistent with end stage degenerative joint disease of the right knee(s) and total knee arthroplasty is deemed medically necessary. The treatment options including medical management, injection therapy arthroscopy and arthroplasty were discussed at length. The risks and benefits of total knee arthroplasty were presented and reviewed. The risks due to  aseptic loosening, infection, stiffness, patella tracking problems, thromboembolic complications and other imponderables were discussed. The patient acknowledged the explanation, agreed to proceed with the plan and consent was signed. Patient is being admitted for inpatient treatment for surgery, pain control, PT, OT, prophylactic antibiotics, VTE prophylaxis, progressive ambulation and ADL's and discharge planning. The patient is planning to be discharged home with home health services

## 2012-02-01 ENCOUNTER — Encounter (HOSPITAL_COMMUNITY)
Admission: RE | Disposition: A | Discharge status: Skilled Nursing Facility | Payer: Self-pay | Source: Ambulatory Visit | Attending: Orthopedic Surgery

## 2012-02-01 ENCOUNTER — Inpatient Hospital Stay (HOSPITAL_COMMUNITY)
Admission: RE | Admit: 2012-02-01 | Discharge: 2012-02-04 | DRG: 470 | Disposition: A | Discharge status: Skilled Nursing Facility | Payer: Medicare Other | Source: Ambulatory Visit | Attending: Orthopedic Surgery | Admitting: Orthopedic Surgery

## 2012-02-01 ENCOUNTER — Encounter (HOSPITAL_COMMUNITY): Payer: Self-pay | Admitting: *Deleted

## 2012-02-01 ENCOUNTER — Inpatient Hospital Stay (HOSPITAL_COMMUNITY): Payer: Medicare Other | Admitting: Vascular Surgery

## 2012-02-01 ENCOUNTER — Encounter (HOSPITAL_COMMUNITY): Payer: Self-pay | Admitting: Vascular Surgery

## 2012-02-01 DIAGNOSIS — I714 Abdominal aortic aneurysm, without rupture, unspecified: Secondary | ICD-10-CM | POA: Diagnosis present

## 2012-02-01 DIAGNOSIS — Z9010 Acquired absence of unspecified breast and nipple: Secondary | ICD-10-CM

## 2012-02-01 DIAGNOSIS — M159 Polyosteoarthritis, unspecified: Secondary | ICD-10-CM | POA: Diagnosis present

## 2012-02-01 DIAGNOSIS — F411 Generalized anxiety disorder: Secondary | ICD-10-CM | POA: Diagnosis present

## 2012-02-01 DIAGNOSIS — Z90710 Acquired absence of both cervix and uterus: Secondary | ICD-10-CM

## 2012-02-01 DIAGNOSIS — M1710 Unilateral primary osteoarthritis, unspecified knee: Principal | ICD-10-CM | POA: Diagnosis present

## 2012-02-01 DIAGNOSIS — Z853 Personal history of malignant neoplasm of breast: Secondary | ICD-10-CM

## 2012-02-01 DIAGNOSIS — I10 Essential (primary) hypertension: Secondary | ICD-10-CM | POA: Diagnosis present

## 2012-02-01 DIAGNOSIS — E785 Hyperlipidemia, unspecified: Secondary | ICD-10-CM | POA: Diagnosis present

## 2012-02-01 DIAGNOSIS — Z9089 Acquired absence of other organs: Secondary | ICD-10-CM

## 2012-02-01 HISTORY — PX: TOTAL KNEE ARTHROPLASTY: SHX125

## 2012-02-01 HISTORY — DX: Essential (primary) hypertension: I10

## 2012-02-01 HISTORY — DX: Reserved for inherently not codable concepts without codable children: IMO0001

## 2012-02-01 SURGERY — ARTHROPLASTY, KNEE, TOTAL
Anesthesia: General | Site: Knee | Laterality: Right | Wound class: Clean

## 2012-02-01 MED ORDER — METOCLOPRAMIDE HCL 10 MG PO TABS: 5.0000 mg | ORAL_TABLET | Freq: Three times a day (TID) | ORAL | Status: DC | PRN

## 2012-02-01 MED ORDER — ACETAMINOPHEN 650 MG RE SUPP: 650.0000 mg | Freq: Four times a day (QID) | RECTAL | Status: DC | PRN

## 2012-02-01 MED ORDER — LORAZEPAM 0.5 MG PO TABS
0.5000 mg | ORAL_TABLET | Freq: Every evening | ORAL | Status: DC | PRN
  Administered 2012-02-03 (×2): 0.5 mg via ORAL
  Filled 2012-02-01 (×2): qty 1

## 2012-02-01 MED ORDER — METHOCARBAMOL 100 MG/ML IJ SOLN
500.0000 mg | Freq: Four times a day (QID) | INTRAVENOUS | Status: DC | PRN
  Administered 2012-02-01 – 2012-02-02 (×2): 500 mg via INTRAVENOUS
  Filled 2012-02-01 (×2): qty 5

## 2012-02-01 MED ORDER — METHOCARBAMOL 500 MG PO TABS
500.0000 mg | ORAL_TABLET | Freq: Four times a day (QID) | ORAL | Status: DC | PRN
  Filled 2012-02-01: qty 1

## 2012-02-01 MED ORDER — BUPIVACAINE-EPINEPHRINE (PF) 0.5% -1:200000 IJ SOLN
INTRAMUSCULAR | Status: AC
  Filled 2012-02-01: qty 10

## 2012-02-01 MED ORDER — CEFAZOLIN SODIUM-DEXTROSE 2-3 GM-% IV SOLR
2.0000 g | Freq: Four times a day (QID) | INTRAVENOUS | Status: AC
  Administered 2012-02-01 (×2): 2 g via INTRAVENOUS
  Filled 2012-02-01 (×3): qty 50

## 2012-02-01 MED ORDER — CIPROFLOXACIN IN D5W 400 MG/200ML IV SOLN
400.0000 mg | INTRAVENOUS | Status: DC
  Filled 2012-02-01: qty 200

## 2012-02-01 MED ORDER — 0.9 % SODIUM CHLORIDE (POUR BTL) OPTIME
TOPICAL | Status: DC | PRN
  Administered 2012-02-01: 3000 mL
  Administered 2012-02-01: 1000 mL

## 2012-02-01 MED ORDER — SODIUM CHLORIDE 0.9 % IJ SOLN: 9.0000 mL | INTRAMUSCULAR | Status: DC | PRN

## 2012-02-01 MED ORDER — NALOXONE HCL 0.4 MG/ML IJ SOLN: 0.4000 mg | INTRAMUSCULAR | Status: DC | PRN

## 2012-02-01 MED ORDER — ACETAMINOPHEN 10 MG/ML IV SOLN
1000.0000 mg | Freq: Four times a day (QID) | INTRAVENOUS | Status: AC
  Administered 2012-02-01 – 2012-02-02 (×3): 1000 mg via INTRAVENOUS
  Filled 2012-02-01 (×4): qty 100

## 2012-02-01 MED ORDER — LABETALOL HCL 5 MG/ML IV SOLN
INTRAVENOUS | Status: DC | PRN
  Administered 2012-02-01 (×2): 5 mg via INTRAVENOUS

## 2012-02-01 MED ORDER — WARFARIN - PHARMACIST DOSING INPATIENT: Freq: Every day | Status: DC

## 2012-02-01 MED ORDER — ONDANSETRON HCL 4 MG PO TABS: 4.0000 mg | ORAL_TABLET | Freq: Four times a day (QID) | ORAL | Status: DC | PRN

## 2012-02-01 MED ORDER — ACETAMINOPHEN 10 MG/ML IV SOLN: 1000.0000 mg | Freq: Once | INTRAVENOUS | Status: DC | PRN

## 2012-02-01 MED ORDER — METOPROLOL TARTRATE 25 MG PO TABS
25.0000 mg | ORAL_TABLET | Freq: Every day | ORAL | Status: DC
  Administered 2012-02-02 – 2012-02-04 (×3): 25 mg via ORAL
  Filled 2012-02-01 (×4): qty 1

## 2012-02-01 MED ORDER — HYDROMORPHONE HCL PF 1 MG/ML IJ SOLN
INTRAMUSCULAR | Status: AC
  Filled 2012-02-01: qty 1

## 2012-02-01 MED ORDER — ATORVASTATIN CALCIUM 10 MG PO TABS
10.0000 mg | ORAL_TABLET | Freq: Every day | ORAL | Status: DC
  Administered 2012-02-02 – 2012-02-03 (×2): 10 mg via ORAL
  Filled 2012-02-01 (×4): qty 1

## 2012-02-01 MED ORDER — ACETAMINOPHEN 10 MG/ML IV SOLN
1000.0000 mg | Freq: Once | INTRAVENOUS | Status: AC
  Administered 2012-02-01: 1000 mg via INTRAVENOUS

## 2012-02-01 MED ORDER — MORPHINE SULFATE (PF) 1 MG/ML IV SOLN
INTRAVENOUS | Status: AC
  Administered 2012-02-01: 3 mg via INTRAVENOUS
  Administered 2012-02-01: 1 mg via INTRAVENOUS
  Administered 2012-02-02: 13.76 mg via INTRAVENOUS
  Administered 2012-02-02: 1 mg via INTRAVENOUS
  Administered 2012-02-02: 7 mg via INTRAVENOUS
  Administered 2012-02-02: 6 mg via INTRAVENOUS
  Filled 2012-02-01: qty 25

## 2012-02-01 MED ORDER — WARFARIN SODIUM 5 MG PO TABS
5.0000 mg | ORAL_TABLET | Freq: Once | ORAL | Status: AC
  Administered 2012-02-01: 5 mg via ORAL
  Filled 2012-02-01: qty 1

## 2012-02-01 MED ORDER — CIPROFLOXACIN IN D5W 400 MG/200ML IV SOLN
INTRAVENOUS | Status: DC | PRN
  Administered 2012-02-01: 400 mg via INTRAVENOUS

## 2012-02-01 MED ORDER — PHENYLEPHRINE HCL 10 MG/ML IJ SOLN
10.0000 mg | INTRAVENOUS | Status: DC | PRN
  Administered 2012-02-01: 40 ug/min via INTRAVENOUS

## 2012-02-01 MED ORDER — CIPROFLOXACIN IN D5W 400 MG/200ML IV SOLN
400.0000 mg | Freq: Two times a day (BID) | INTRAVENOUS | Status: DC
  Filled 2012-02-01: qty 200

## 2012-02-01 MED ORDER — MIDAZOLAM HCL 5 MG/5ML IJ SOLN
INTRAMUSCULAR | Status: DC | PRN
  Administered 2012-02-01: 0.5 mg via INTRAVENOUS

## 2012-02-01 MED ORDER — DIPHENHYDRAMINE HCL 12.5 MG/5ML PO ELIX
12.5000 mg | ORAL_SOLUTION | Freq: Four times a day (QID) | ORAL | Status: DC | PRN
  Filled 2012-02-01: qty 5

## 2012-02-01 MED ORDER — DOCUSATE SODIUM 100 MG PO CAPS
100.0000 mg | ORAL_CAPSULE | Freq: Two times a day (BID) | ORAL | Status: DC
  Administered 2012-02-01 – 2012-02-04 (×7): 100 mg via ORAL
  Filled 2012-02-01 (×6): qty 1

## 2012-02-01 MED ORDER — METOCLOPRAMIDE HCL 5 MG/ML IJ SOLN
5.0000 mg | Freq: Three times a day (TID) | INTRAMUSCULAR | Status: DC | PRN
  Filled 2012-02-01: qty 2

## 2012-02-01 MED ORDER — FENTANYL CITRATE 0.05 MG/ML IJ SOLN
INTRAMUSCULAR | Status: DC | PRN
  Administered 2012-02-01: 50 ug via INTRAVENOUS
  Administered 2012-02-01 (×2): 100 ug via INTRAVENOUS

## 2012-02-01 MED ORDER — HYDROCODONE-ACETAMINOPHEN 5-325 MG PO TABS
1.0000 | ORAL_TABLET | ORAL | Status: DC | PRN
  Administered 2012-02-02: 2 via ORAL
  Administered 2012-02-02 – 2012-02-04 (×4): 1 via ORAL
  Filled 2012-02-01: qty 1
  Filled 2012-02-01 (×2): qty 2
  Filled 2012-02-01 (×2): qty 1

## 2012-02-01 MED ORDER — ONDANSETRON HCL 4 MG/2ML IJ SOLN
4.0000 mg | Freq: Four times a day (QID) | INTRAMUSCULAR | Status: DC | PRN
  Administered 2012-02-02: 4 mg via INTRAVENOUS
  Filled 2012-02-01: qty 2

## 2012-02-01 MED ORDER — POTASSIUM CHLORIDE IN NACL 20-0.9 MEQ/L-% IV SOLN
INTRAVENOUS | Status: AC
  Administered 2012-02-01: 15:00:00 via INTRAVENOUS
  Administered 2012-02-02: 75 mL/h via INTRAVENOUS
  Administered 2012-02-02 – 2012-02-03 (×2): via INTRAVENOUS
  Filled 2012-02-01 (×4): qty 1000

## 2012-02-01 MED ORDER — DIPHENHYDRAMINE HCL 50 MG/ML IJ SOLN: 12.5000 mg | Freq: Four times a day (QID) | INTRAMUSCULAR | Status: DC | PRN

## 2012-02-01 MED ORDER — ONDANSETRON HCL 4 MG/2ML IJ SOLN: 4.0000 mg | Freq: Four times a day (QID) | INTRAMUSCULAR | Status: DC | PRN

## 2012-02-01 MED ORDER — LIDOCAINE HCL (CARDIAC) 20 MG/ML IV SOLN
INTRAVENOUS | Status: DC | PRN
  Administered 2012-02-01: 60 mg via INTRAVENOUS

## 2012-02-01 MED ORDER — BUPIVACAINE HCL (PF) 0.25 % IJ SOLN
INTRAMUSCULAR | Status: AC
  Filled 2012-02-01: qty 30

## 2012-02-01 MED ORDER — ONDANSETRON HCL 4 MG/2ML IJ SOLN
INTRAMUSCULAR | Status: DC | PRN
  Administered 2012-02-01: 4 mg via INTRAVENOUS

## 2012-02-01 MED ORDER — COUMADIN BOOK
Freq: Once | Status: DC
  Filled 2012-02-01: qty 1

## 2012-02-01 MED ORDER — GLYCOPYRROLATE 0.2 MG/ML IJ SOLN
INTRAMUSCULAR | Status: DC | PRN
  Administered 2012-02-01: .4 mg via INTRAVENOUS

## 2012-02-01 MED ORDER — ACETAMINOPHEN 325 MG PO TABS: 650.0000 mg | ORAL_TABLET | Freq: Four times a day (QID) | ORAL | Status: DC | PRN

## 2012-02-01 MED ORDER — ACETAMINOPHEN 10 MG/ML IV SOLN
INTRAVENOUS | Status: AC
  Filled 2012-02-01: qty 100

## 2012-02-01 MED ORDER — MORPHINE SULFATE (PF) 1 MG/ML IV SOLN
INTRAVENOUS | Status: AC
  Filled 2012-02-01: qty 25

## 2012-02-01 MED ORDER — CLONIDINE HCL (ANALGESIA) 100 MCG/ML EP SOLN
150.0000 ug | EPIDURAL | Status: DC
  Filled 2012-02-01: qty 1.5

## 2012-02-01 MED ORDER — HYDROMORPHONE HCL PF 1 MG/ML IJ SOLN
0.2500 mg | INTRAMUSCULAR | Status: DC | PRN
  Administered 2012-02-01 (×2): 0.25 mg via INTRAVENOUS
  Administered 2012-02-01: 0.5 mg via INTRAVENOUS

## 2012-02-01 MED ORDER — PHENOL 1.4 % MT LIQD
1.0000 | OROMUCOSAL | Status: DC | PRN
  Filled 2012-02-01: qty 177

## 2012-02-01 MED ORDER — HYDROCHLOROTHIAZIDE 25 MG PO TABS
25.0000 mg | ORAL_TABLET | Freq: Every day | ORAL | Status: DC
Start: 2012-02-01 — End: 2012-02-04
  Administered 2012-02-01 – 2012-02-04 (×4): 25 mg via ORAL
  Filled 2012-02-01 (×4): qty 1

## 2012-02-01 MED ORDER — ONDANSETRON HCL 4 MG/2ML IJ SOLN: 4.0000 mg | Freq: Once | INTRAMUSCULAR | Status: DC | PRN

## 2012-02-01 MED ORDER — PROPOFOL 10 MG/ML IV BOLUS
INTRAVENOUS | Status: DC | PRN
  Administered 2012-02-01: 150 mg via INTRAVENOUS

## 2012-02-01 MED ORDER — NEOSTIGMINE METHYLSULFATE 1 MG/ML IJ SOLN
INTRAMUSCULAR | Status: DC | PRN
  Administered 2012-02-01: 3 mg via INTRAVENOUS

## 2012-02-01 MED ORDER — MENTHOL 3 MG MT LOZG: 1.0000 | LOZENGE | OROMUCOSAL | Status: DC | PRN

## 2012-02-01 MED ORDER — WARFARIN VIDEO: Freq: Once | Status: DC

## 2012-02-01 MED ORDER — LACTATED RINGERS IV SOLN
INTRAVENOUS | Status: DC | PRN
  Administered 2012-02-01 (×2): via INTRAVENOUS

## 2012-02-01 MED ORDER — LOSARTAN POTASSIUM 50 MG PO TABS
100.0000 mg | ORAL_TABLET | Freq: Every day | ORAL | Status: DC
  Administered 2012-02-01 – 2012-02-04 (×4): 100 mg via ORAL
  Filled 2012-02-01 (×4): qty 2

## 2012-02-01 MED ORDER — ROCURONIUM BROMIDE 100 MG/10ML IV SOLN
INTRAVENOUS | Status: DC | PRN
  Administered 2012-02-01: 10 mg via INTRAVENOUS
  Administered 2012-02-01: 40 mg via INTRAVENOUS

## 2012-02-01 MED ORDER — MORPHINE SULFATE 4 MG/ML IJ SOLN
INTRAMUSCULAR | Status: AC
  Filled 2012-02-01: qty 2

## 2012-02-01 SURGICAL SUPPLY — 77 items
BANDAGE ELASTIC 4 VELCRO ST LF (GAUZE/BANDAGES/DRESSINGS) ×2 IMPLANT
BANDAGE ELASTIC 6 VELCRO ST LF (GAUZE/BANDAGES/DRESSINGS) ×1 IMPLANT
BANDAGE ESMARK 6X9 LF (GAUZE/BANDAGES/DRESSINGS) ×1 IMPLANT
BLADE SAG 18X100X1.27 (BLADE) ×2 IMPLANT
BLADE SAW SGTL 13.0X1.19X90.0M (BLADE) ×2 IMPLANT
BNDG CMPR 9X6 STRL LF SNTH (GAUZE/BANDAGES/DRESSINGS) ×1
BNDG CMPR MED 10X6 ELC LF (GAUZE/BANDAGES/DRESSINGS) ×3
BNDG COHESIVE 6X5 TAN STRL LF (GAUZE/BANDAGES/DRESSINGS) ×2 IMPLANT
BNDG ELASTIC 6X10 VLCR STRL LF (GAUZE/BANDAGES/DRESSINGS) ×6 IMPLANT
BNDG ESMARK 6X9 LF (GAUZE/BANDAGES/DRESSINGS) ×2
BOWL SMART MIX CTS (DISPOSABLE) ×2 IMPLANT
CEMENT HV SMART SET (Cement) ×4 IMPLANT
CLOTH BEACON ORANGE TIMEOUT ST (SAFETY) ×2 IMPLANT
COVER BACK TABLE 24X17X13 BIG (DRAPES) IMPLANT
COVER SURGICAL LIGHT HANDLE (MISCELLANEOUS) ×2 IMPLANT
CUFF TOURNIQUET SINGLE 34IN LL (TOURNIQUET CUFF) ×1 IMPLANT
CUFF TOURNIQUET SINGLE 44IN (TOURNIQUET CUFF) IMPLANT
DRAPE INCISE IOBAN 66X45 STRL (DRAPES) ×1 IMPLANT
DRAPE ORTHO SPLIT 77X108 STRL (DRAPES) ×4
DRAPE PROXIMA HALF (DRAPES) ×2 IMPLANT
DRAPE SURG ORHT 6 SPLT 77X108 (DRAPES) ×2 IMPLANT
DRAPE U-SHAPE 47X51 STRL (DRAPES) ×2 IMPLANT
DRAPE X-RAY CASS 24X20 (DRAPES) IMPLANT
DRSG PAD ABDOMINAL 8X10 ST (GAUZE/BANDAGES/DRESSINGS) ×3 IMPLANT
DURAPREP 26ML APPLICATOR (WOUND CARE) ×2 IMPLANT
ELECT REM PT RETURN 9FT ADLT (ELECTROSURGICAL) ×2
ELECTRODE REM PT RTRN 9FT ADLT (ELECTROSURGICAL) ×1 IMPLANT
EVACUATOR 1/8 PVC DRAIN (DRAIN) ×2 IMPLANT
FACESHIELD LNG OPTICON STERILE (SAFETY) ×2 IMPLANT
GAUZE XEROFORM 5X9 LF (GAUZE/BANDAGES/DRESSINGS) ×2 IMPLANT
GLOVE BIO SURGEON ST LM GN SZ9 (GLOVE) ×2 IMPLANT
GLOVE BIOGEL PI IND STRL 8 (GLOVE) ×1 IMPLANT
GLOVE BIOGEL PI INDICATOR 8 (GLOVE) ×1
GLOVE SURG ORTHO 8.0 STRL STRW (GLOVE) ×2 IMPLANT
GOWN PREVENTION PLUS LG XLONG (DISPOSABLE) ×1 IMPLANT
GOWN PREVENTION PLUS XLARGE (GOWN DISPOSABLE) ×2 IMPLANT
GOWN STRL NON-REIN LRG LVL3 (GOWN DISPOSABLE) ×6 IMPLANT
HANDPIECE INTERPULSE COAX TIP (DISPOSABLE) ×2
HOOD PEEL AWAY FACE SHEILD DIS (HOOD) ×6 IMPLANT
IMMOBILIZER KNEE 20 (SOFTGOODS)
IMMOBILIZER KNEE 20 THIGH 36 (SOFTGOODS) IMPLANT
IMMOBILIZER KNEE 22 UNIV (SOFTGOODS) ×1 IMPLANT
IMMOBILIZER KNEE 24 THIGH 36 (MISCELLANEOUS) IMPLANT
IMMOBILIZER KNEE 24 UNIV (MISCELLANEOUS)
KIT BASIN OR (CUSTOM PROCEDURE TRAY) ×2 IMPLANT
KIT ROOM TURNOVER OR (KITS) ×2 IMPLANT
MANIFOLD NEPTUNE II (INSTRUMENTS) ×2 IMPLANT
MARKER SPHERE PSV REFLC THRD 5 (MARKER) ×3 IMPLANT
NDL 18GX1X1/2 (RX/OR ONLY) (NEEDLE) ×1 IMPLANT
NDL SPNL 18GX3.5 QUINCKE PK (NEEDLE) ×1 IMPLANT
NEEDLE 18GX1X1/2 (RX/OR ONLY) (NEEDLE) ×2 IMPLANT
NEEDLE SPNL 18GX3.5 QUINCKE PK (NEEDLE) ×2 IMPLANT
NS IRRIG 1000ML POUR BTL (IV SOLUTION) ×2 IMPLANT
PACK TOTAL JOINT (CUSTOM PROCEDURE TRAY) ×2 IMPLANT
PAD ARMBOARD 7.5X6 YLW CONV (MISCELLANEOUS) ×4 IMPLANT
PAD CAST 4YDX4 CTTN HI CHSV (CAST SUPPLIES) ×1 IMPLANT
PADDING CAST COTTON 4X4 STRL (CAST SUPPLIES) ×2
PADDING CAST COTTON 6X4 STRL (CAST SUPPLIES) ×4 IMPLANT
PIN SCHANZ 4MM 130MM (PIN) ×8 IMPLANT
RUBBERBAND STERILE (MISCELLANEOUS) ×2 IMPLANT
SET HNDPC FAN SPRY TIP SCT (DISPOSABLE) ×1 IMPLANT
SPONGE GAUZE 4X4 12PLY (GAUZE/BANDAGES/DRESSINGS) ×2 IMPLANT
SPONGE LAP 18X18 X RAY DECT (DISPOSABLE) ×1 IMPLANT
STAPLER VISISTAT 35W (STAPLE) ×2 IMPLANT
SUCTION FRAZIER TIP 10 FR DISP (SUCTIONS) ×2 IMPLANT
SUT ETHILON 3 0 PS 1 (SUTURE) ×4 IMPLANT
SUT VIC AB 0 CTB1 27 (SUTURE) ×6 IMPLANT
SUT VIC AB 1 CT1 27 (SUTURE) ×10
SUT VIC AB 1 CT1 27XBRD ANBCTR (SUTURE) ×5 IMPLANT
SUT VIC AB 2-0 CT1 27 (SUTURE) ×4
SUT VIC AB 2-0 CT1 TAPERPNT 27 (SUTURE) ×2 IMPLANT
SYR 30ML SLIP (SYRINGE) ×2 IMPLANT
SYR TB 1ML LUER SLIP (SYRINGE) ×2 IMPLANT
TOWEL OR 17X24 6PK STRL BLUE (TOWEL DISPOSABLE) ×2 IMPLANT
TOWEL OR 17X26 10 PK STRL BLUE (TOWEL DISPOSABLE) ×4 IMPLANT
TRAY FOLEY CATH 14FR (SET/KITS/TRAYS/PACK) ×2 IMPLANT
WATER STERILE IRR 1000ML POUR (IV SOLUTION) ×6 IMPLANT

## 2012-02-01 NOTE — Progress Notes (Signed)
Orthopedic Tech Progress Note Patient Details:  Kelli Rodriguez 1934-05-17 [id number] OHF applied to bed in PACU. CPM to be applied to at 1800 per doctor's note. Patient ID: Kelli Rodriguez, female   DOB: May 02, 1934, 78 y.o.   MRN: [id number]   [name] 02/01/2012, 12:52 PM

## 2012-02-01 NOTE — Progress Notes (Signed)
Orthopedic Tech Progress Note Patient Details:  Kelli Rodriguez 1934-06-25 [id number]  CPM Left Knee CPM Left Knee: On Left Knee Flexion (Degrees): 40  Left Knee Extension (Degrees): 0  Additional Comments: pt already has ohf   Corry Ihnen 02/01/2012, 6:22 PM

## 2012-02-01 NOTE — Progress Notes (Signed)
Pt. Concerned that her left knee is hurting more than the right knee. Pt. Questioning if surgery should be done on left knee instead. Pt. States both knees need the replacement. Informed pt. To speak to Dr. August Saucer in the holding area. I paged Dr. [phone]  and left a note on front of the pt's chart reguarding her concern. Dr. August Saucer has  Not returned paged.

## 2012-02-01 NOTE — Progress Notes (Signed)
Orthopedic Tech Progress Note Patient Details:  Kelli Rodriguez [birth year] [id number] Correction; cpm was applied to right knee;not left knee Patient ID: Kelli Rodriguez, female   DOB: 1934/02/04, 77 y.o.   MRN: [id number]   [name] 02/01/2012, 6:32 PM

## 2012-02-01 NOTE — Progress Notes (Signed)
Orthopedic Tech Progress Note Patient Details:  [name] 07-16-1934 [id number]  Patient ID: Kelli Rodriguez, female   DOB: [birth year], 77 y.o.   MRN: [id number] Took pt off cpm @2025   [name] 02/01/2012, 8:29 PM

## 2012-02-01 NOTE — Preoperative (Signed)
Beta Blockers   Reason not to administer Beta Blockers:Not Applicable 

## 2012-02-01 NOTE — Anesthesia Postprocedure Evaluation (Signed)
  Anesthesia Post-op Note  Patient: Kelli Rodriguez  Procedure(s) Performed: Procedure(s) (LRB) with comments: TOTAL KNEE ARTHROPLASTY (Right) - Right total knee arthroplasty  Patient Location: PACU  Anesthesia Type:General and GA combined with regional for post-op pain  Level of Consciousness: awake, alert  and oriented  Airway and Oxygen Therapy: Patient Spontanous Breathing and Patient connected to nasal cannula oxygen  Post-op Pain: mild  Post-op Assessment: Post-op Vital signs reviewed and Patient's Cardiovascular Status Stable  Post-op Vital Signs: stable  Complications: No apparent anesthesia complications

## 2012-02-01 NOTE — Interval H&P Note (Signed)
History and Physical Interval Note:  02/01/2012 7:46 AM  Kelli Rodriguez  has presented today for surgery, with the diagnosis of Right knee osteoarthritis  The various methods of treatment have been discussed with the patient and family. After consideration of risks, benefits and other options for treatment, the patient has consented to  Procedure(s) (LRB) with comments: TOTAL KNEE ARTHROPLASTY (Right) - Right total knee arthroplasty as a surgical intervention .  The patient's history has been reviewed, patient examined, no change in status, stable for surgery.  I have reviewed the patient's chart and labs.  Questions were answered to the patient's satisfaction.     [name],[name]  Pt for tka today   ua pos for bact and nitrate - clean catch - no uti sxs and normal wbc no fevers - will add cipro and recheck with foley

## 2012-02-01 NOTE — Transfer of Care (Signed)
Immediate Anesthesia Transfer of Care Note  Patient: Kelli Rodriguez  Procedure(s) Performed: Procedure(s) (LRB) with comments: TOTAL KNEE ARTHROPLASTY (Right) - Right total knee arthroplasty  Patient Location: PACU  Anesthesia Type:General  Level of Consciousness: awake and sedated  Airway & Oxygen Therapy: Patient Spontanous Breathing and Patient connected to face mask oxygen  Post-op Assessment: Report given to PACU RN, Post -op Vital signs reviewed and stable, Patient moving all extremities and Patient moving all extremities X 4  Post vital signs: Reviewed and stable  Complications: No apparent anesthesia complications

## 2012-02-01 NOTE — Progress Notes (Signed)
ANTICOAGULATION CONSULT NOTE - Initial Consult  Pharmacy Consult for Coumadin Indication: VTE prophylaxis  No Known Allergies  Patient Measurements:   Heparin Dosing Weight:   Vital Signs: Temp: 97.5 F (36.4 C) (12/31 1145) Temp src: Oral (12/31 0603) BP: 121/49 mmHg (12/31 1145) Pulse Rate: 66  (12/31 1145)  Labs: No results found for this basename: HGB:2,HCT:3,PLT:3,APTT:3,LABPROT:3,INR:3,HEPARINUNFRC:3,CREATININE:3,CKTOTAL:3,CKMB:3,TROPONINI:3 in the last 72 hours  The CrCl is unknown because both a height and weight (above a minimum accepted value) are required for this calculation.   Medical History: Past Medical History  Diagnosis Date  . Bronchitis, chronic   . Arthritis     knees and hands  . Anxiety   . Ascending aortic aneurysm     followed by Dr. Laneta Simmers, 4.3 cm 08/2011 by MRA  . Cancer     right breast cancer s/p mastectomy '00    Medications:  Scheduled:    . [COMPLETED] acetaminophen  1,000 mg Intravenous Once  . acetaminophen  1,000 mg Intravenous Q6H  . atorvastatin  10 mg Oral q1800  . [COMPLETED]  ceFAZolin (ANCEF) IV  2 g Intravenous 60 min Pre-Op  .  ceFAZolin (ANCEF) IV  2 g Intravenous Q6H  . docusate sodium  100 mg Oral BID  . hydrochlorothiazide  25 mg Oral Daily  . HYDROmorphone      . losartan  100 mg Oral Daily  . metoprolol tartrate  25 mg Oral Daily  . morphine   Intravenous Q4H  . morphine      . [DISCONTINUED] chlorhexidine  60 mL Topical Once  . [DISCONTINUED] chlorhexidine  60 mL Topical Once  . [DISCONTINUED] ciprofloxacin  400 mg Intravenous To OR  . [DISCONTINUED] ciprofloxacin  400 mg Intravenous Q12H  . [DISCONTINUED] cloNIDine  150 mcg Intra-articular To OR    Assessment: 77yo female s/p R-TKA, to start Coumadin for VTE px.  She has a hx of hemoptysis- occurred once ~3y ago and cause of which was unable to be determined.  She also has a AAA which has been stable and is being followed every 45yr.   No baseline INR was  done.  Goal of Therapy:  INR 2-3 Monitor platelets by anticoagulation protocol: Yes   Plan:  1.  Baseline INR 2.  Coumadin 5mg  3.  Daily INR 4.  Coumadin book and video  Marisue Humble, PharmD Clinical Pharmacist [facility]

## 2012-02-01 NOTE — Evaluation (Signed)
Physical Therapy Evaluation Patient Details Name: Kelli Rodriguez MRN: [id number] DOB: 1934/11/12 Today's Date: 02/01/2012 Time: 4098-1191 PT Time Calculation (min): 32 min  PT Assessment / Plan / Recommendation Clinical Impression  pt presents with R TKA.  pt moving very well, with minimal A, however gait limited by feelings of lightheadedness this pm.  Anticipate pt will make good progress and plans D/C to SNF for rehab prior to returning home.      PT Assessment  Patient needs continued PT services    Follow Up Recommendations  SNF    Does the patient have the potential to tolerate intense rehabilitation      Barriers to Discharge None      Equipment Recommendations  Rolling walker with 5" wheels (3-in-1)    Recommendations for Other Services     Frequency 7X/week    Precautions / Restrictions Precautions Precautions: Knee;Fall Precaution Booklet Issued: No Required Braces or Orthoses: Knee Immobilizer - Right Knee Immobilizer - Right: On except when in CPM Restrictions Weight Bearing Restrictions: Yes RLE Weight Bearing: Weight bearing as tolerated   Pertinent Vitals/Pain Indicates R knee pain, but did not rate.        Mobility  Bed Mobility Bed Mobility: Supine to Sit;Sitting - Scoot to Edge of Bed Supine to Sit: 4: Min assist;With rails Sitting - Scoot to [place] of Bed: 4: Min assist Details for Bed Mobility Assistance: A with R LE only.   Transfers Transfers: Sit to Stand;Stand to Sit Sit to Stand: 4: Min assist;With upper extremity assist;From bed Stand to Sit: 4: Min assist;With upper extremity assist;To chair/3-in-1;With armrests Details for Transfer Assistance: Cues for UE use, positioning of LEs prior to sitting.   Ambulation/Gait Ambulation/Gait Assistance: 4: Min guard Ambulation Distance (Feet): 5 Feet Assistive device: Rolling walker Ambulation/Gait Assistance Details: cues for positioning in RW, gait sequencing, upright posture.  pt began to  c/o feeling lightheaded and needed to sit down.   Gait Pattern: Step-to pattern;Decreased step length - left;Decreased stance time - right Stairs: No Wheelchair Mobility Wheelchair Mobility: No    Shoulder Instructions     Exercises     PT Diagnosis: Abnormality of gait;Acute pain  PT Problem List: Decreased strength;Decreased range of motion;Decreased activity tolerance;Decreased balance;Decreased mobility;Decreased knowledge of use of DME;Pain PT Treatment Interventions: DME instruction;Gait training;Stair training;Functional mobility training;Therapeutic activities;Therapeutic exercise;Balance training;Patient/family education   PT Goals Acute Rehab PT Goals PT Goal Formulation: With patient Time For Goal Achievement: 02/08/12 Potential to Achieve Goals: Good Pt will go Supine/Side to Sit: with modified independence PT Goal: Supine/Side to Sit - Progress: Goal set today Pt will go Sit to Supine/Side: with modified independence PT Goal: Sit to Supine/Side - Progress: Goal set today Pt will go Sit to Stand: with modified independence PT Goal: Sit to Stand - Progress: Goal set today Pt will go Stand to Sit: with modified independence PT Goal: Stand to Sit - Progress: Goal set today Pt will Ambulate: >150 feet;with modified independence;with rolling walker PT Goal: Ambulate - Progress: Goal set today Pt will Go Up / Down Stairs: 3-5 stairs;with min assist;with least restrictive assistive device PT Goal: Up/Down Stairs - Progress: Goal set today Pt will Perform Home Exercise Program: with supervision, verbal cues required/provided PT Goal: Perform Home Exercise Program - Progress: Goal set today  Visit Information  Last PT Received On: 02/01/12 Assistance Needed: +1    Subjective Data  Subjective: My other knee isn't so good either.   Patient Stated Goal: Active with  grandchildren.     Prior Functioning  Home Living Lives With: Alone Available Help at Discharge: Skilled  Nursing Facility Type of Home: Skilled Nursing Facility Additional Comments: pt plans to D/C to SNF for rehab prior to returning home.   Prior Function Level of Independence: Independent Able to Take Stairs?: Yes Driving: Yes Vocation: Retired Musician: No difficulties    Cognition  Overall Cognitive Status: Appears within functional limits for tasks assessed/performed Arousal/Alertness: Awake/alert Orientation Level: Appears intact for tasks assessed Behavior During Session: [facility] for tasks performed    Extremity/Trunk Assessment Right Lower Extremity Assessment RLE ROM/Strength/Tone: Deficits RLE ROM/Strength/Tone Deficits: New TKA.   RLE Sensation: WFL - Light Touch Left Lower Extremity Assessment LLE ROM/Strength/Tone: WFL for tasks assessed LLE Sensation: WFL - Light Touch Trunk Assessment Trunk Assessment: Normal   Balance Balance Balance Assessed: No  End of Session PT - End of Session Equipment Utilized During Treatment: Gait belt;Right knee immobilizer Activity Tolerance: Treatment limited secondary to medical complications (Comment) (pt feeling lightheaded) Patient left: in chair;with call bell/phone within reach;with family/visitor present Nurse Communication: Mobility status  GP     Sunny Schlein, [place] [phone] 02/01/2012, 3:08 PM

## 2012-02-01 NOTE — Progress Notes (Signed)
Utilization review completed. Ashleyanne Hemmingway, RN, BSN. 

## 2012-02-01 NOTE — Anesthesia Procedure Notes (Signed)
Anesthesia Regional Block:  Femoral nerve block  Pre-Anesthetic Checklist: ,, timeout performed, Correct Patient, Correct Site, Correct Laterality, Correct Procedure, Correct Position, site marked, Risks and benefits discussed,  Surgical consent,  Pre-op evaluation,  At surgeon's request and post-op pain management  Laterality: Right  Prep: chloraprep       Needles:  Injection technique: Single-shot  Needle Type: Echogenic Stimulator Needle     Needle Length:cm 9 cm Needle Gauge: 22 and 22 G    Additional Needles:  Procedures: ultrasound guided (picture in chart) and nerve stimulator Femoral nerve block Narrative:  Start time: 02/01/2012 6:50 AM End time: 02/01/2012 7:00 AM Injection made incrementally with aspirations every 5 mL.  Performed by: Personally   Additional Notes: 20 cc 0.5% Marcaine with 1:200 Epi injected easily

## 2012-02-01 NOTE — Brief Op Note (Signed)
02/01/2012  10:30 AM  PATIENT:  Kelli Rodriguez  77 y.o. female  PRE-OPERATIVE DIAGNOSIS:  Right knee osteoarthritis  POST-OPERATIVE DIAGNOSIS:  Right knee osteoarthritis  PROCEDURE:  Procedure(s): TOTAL KNEE ARTHROPLASTY  SURGEON:  Surgeon(s): Cammy Copa, MD  ASSISTANT: S vernon pa  ANESTHESIA:   general  EBL: 100 ml    Total I/O In: 1000 [I.V.:1000] Out: 500 [Urine:400; Blood:100]  BLOOD ADMINISTERED: none  DRAINS: none   LOCAL MEDICATIONS USED:  none  SPECIMEN:  No Specimen  COUNTS:  YES  TOURNIQUET:   Total Tourniquet Time Documented: Thigh (Right) - 91 minutes  DICTATION: .Other Dictation: Dictation Number [id number]  PLAN OF CARE: Admit to inpatient   PATIENT DISPOSITION:  PACU - hemodynamically stable

## 2012-02-01 NOTE — Anesthesia Preprocedure Evaluation (Signed)
Anesthesia Evaluation  Patient identified by MRN, date of birth, ID band Patient awake    Reviewed: Allergy & Precautions, H&P , NPO status , Patient's Chart, lab work & pertinent test results, reviewed documented beta blocker date and time   Airway Mallampati: II      Dental  (+) Edentulous Upper and Edentulous Lower   Pulmonary  breath sounds clear to auscultation        Cardiovascular Rhythm:Regular Rate:Normal     Neuro/Psych    GI/Hepatic   Endo/Other    Renal/GU      Musculoskeletal   Abdominal   Peds  Hematology   Anesthesia Other Findings   Reproductive/Obstetrics                           Anesthesia Physical Anesthesia Plan  ASA: III  Anesthesia Plan: General   Post-op Pain Management:    Induction: Intravenous  Airway Management Planned: Oral ETT  Additional Equipment:   Intra-op Plan:   Post-operative Plan: Extubation in OR  Informed Consent: I have reviewed the patients History and Physical, chart, labs and discussed the procedure including the risks, benefits and alternatives for the proposed anesthesia with the patient or authorized representative who has indicated his/her understanding and acceptance.     Plan Discussed with: CRNA and Surgeon  Anesthesia Plan Comments: (DJD R. Knee Htn Uses home O2 denies COPD  Plan GA with FNB  Kipp Brood, MD)        Anesthesia Quick Evaluation

## 2012-02-02 DIAGNOSIS — I729 Aneurysm of unspecified site: Secondary | ICD-10-CM

## 2012-02-02 DIAGNOSIS — I719 Aortic aneurysm of unspecified site, without rupture: Secondary | ICD-10-CM

## 2012-02-02 HISTORY — DX: Aneurysm of unspecified site: I72.9

## 2012-02-02 HISTORY — DX: Aortic aneurysm of unspecified site, without rupture: I71.9

## 2012-02-02 LAB — BASIC METABOLIC PANEL
BUN: 11 mg/dL (ref 6–23)
Calcium: 8.5 mg/dL (ref 8.4–10.5)
GFR calc non Af Amer: 87 mL/min — ABNORMAL LOW (ref >90)
Glucose, Bld: 129 mg/dL — ABNORMAL HIGH (ref 70–99)

## 2012-02-02 LAB — CBC
HCT: 25.3 % — ABNORMAL LOW (ref 36.0–46.0)
Hemoglobin: 8.9 g/dL — ABNORMAL LOW (ref 12.0–15.0)
MCH: 31.8 pg (ref 26.0–34.0)
MCHC: 35.2 g/dL (ref 30.0–36.0)

## 2012-02-02 LAB — URINALYSIS, ROUTINE W REFLEX MICROSCOPIC
Bilirubin Urine: NEGATIVE
Glucose, UA: NEGATIVE mg/dL
Hgb urine dipstick: NEGATIVE
Protein, ur: NEGATIVE mg/dL
Specific Gravity, Urine: 1.021 (ref 1.005–1.030)
Urobilinogen, UA: 0.2 mg/dL (ref 0.0–1.0)

## 2012-02-02 MED ORDER — WARFARIN SODIUM 5 MG PO TABS
5.0000 mg | ORAL_TABLET | Freq: Once | ORAL | Status: AC
  Administered 2012-02-02: 5 mg via ORAL
  Filled 2012-02-02: qty 1

## 2012-02-02 NOTE — Progress Notes (Signed)
Subjective: Pt stable - pain controlled   Objective: Vital signs in last 24 hours: Temp:  [97.2 F (36.2 C)-99.7 F (37.6 C)] 99.3 F (37.4 C) (01/01 0557) Pulse Rate:  [66-99] 97  (01/01 0557) Resp:  [9-20] 14  (01/01 0557) BP: (103-135)/(49-83) 132/62 mmHg (01/01 0557) SpO2:  [94 %-100 %] 97 % (01/01 0557)  Intake/Output from previous day: 12/31 0701 - 01/01 0700 In: 1000 [I.V.:1000] Out: 2375 [Urine:2275; Blood:100] Intake/Output this shift:    Exam:  Neurovascular intact Sensation intact distally Intact pulses distally Dorsiflexion/Plantar flexion intact  Labs:  Basename 02/02/12 0620  HGB 8.9*    Basename 02/02/12 0620  WBC 10.5  RBC 2.80*  HCT 25.3*  PLT 225   No results found for this basename: NA:2,K:2,CL:2,CO2:2,BUN:2,CREATININE:2,GLUCOSE:2,CALCIUM:2 in the last 72 hours  Basename 02/02/12 0620 02/01/12 1705  LABPT -- --  INR 1.06 1.01    Assessment/Plan: DC PCA at 1300 today - start oral pain meds then - send ua/c and s before dc foley - PT today - will likely go to rehab Friday [facility] is preference   Kelli Rodriguez 02/02/2012, 7:19 AM

## 2012-02-02 NOTE — Progress Notes (Signed)
ANTICOAGULATION CONSULT NOTE - Follow Up Consult  Pharmacy Consult for Coumadin Indication: VTE prophylaxis  No Known Allergies  Patient Measurements:   Heparin Dosing Weight:   Vital Signs: Temp: 99.3 F (37.4 C) (01/01 0557) Temp src: Oral (01/01 0557) BP: 132/62 mmHg (01/01 0557) Pulse Rate: 97  (01/01 0557)  Labs:  Basename 02/02/12 0620 02/01/12 1705  HGB 8.9* --  HCT 25.3* --  PLT 225 --  APTT -- --  LABPROT 13.7 13.2  INR 1.06 1.01  HEPARINUNFRC -- --  CREATININE 0.60 --  CKTOTAL -- --  CKMB -- --  TROPONINI -- --    The CrCl is unknown because both a height and weight (above a minimum accepted value) are required for this calculation.   Medications:  Scheduled:    . [COMPLETED] acetaminophen  1,000 mg Intravenous Once  . acetaminophen  1,000 mg Intravenous Q6H  . atorvastatin  10 mg Oral q1800  . [COMPLETED]  ceFAZolin (ANCEF) IV  2 g Intravenous Q6H  . coumadin book   Does not apply Once  . docusate sodium  100 mg Oral BID  . hydrochlorothiazide  25 mg Oral Daily  . [EXPIRED] HYDROmorphone      . losartan  100 mg Oral Daily  . metoprolol tartrate  25 mg Oral Daily  . morphine   Intravenous Q4H  . [EXPIRED] morphine      . [COMPLETED] warfarin  5 mg Oral ONCE-1800  . warfarin   Does not apply Once  . Warfarin - Pharmacist Dosing Inpatient   Does not apply q1800  . [DISCONTINUED] chlorhexidine  60 mL Topical Once  . [DISCONTINUED] chlorhexidine  60 mL Topical Once  . [DISCONTINUED] ciprofloxacin  400 mg Intravenous To OR  . [DISCONTINUED] ciprofloxacin  400 mg Intravenous Q12H  . [DISCONTINUED] cloNIDine  150 mcg Intra-articular To OR    Assessment: 78yo female s/p R-TKA.  INR 1.06 this AM, after first dose of Coumadin.  No bleeding problems noted.  Plan to d/c to rehab tomorrow.  Goal of Therapy:  INR 2-3 Monitor platelets by anticoagulation protocol: Yes   Plan:  1.  Coumadin 5mg  today 2.  F/U in AM  Marisue Humble, PharmD Clinical  Pharmacist [facility]

## 2012-02-02 NOTE — Evaluation (Signed)
Occupational Therapy Evaluation Patient Details Name: Kelli Rodriguez MRN: [id number] DOB: [birth year] Today's Date: 02/02/2012 Time: 7829-5621 OT Time Calculation (min): 42 min  OT Assessment / Plan / Recommendation Clinical Impression  78 yo female s/p Rt TKA that could benefit from skilled OT acutely. Recommend SNf for d/c planning.    OT Assessment  Patient needs continued OT Services    Follow Up Recommendations  SNF    Barriers to Discharge Decreased caregiver support    Equipment Recommendations  3 in 1 bedside comode    Recommendations for Other Services    Frequency  Min 2X/week    Precautions / Restrictions Precautions Precautions: Knee;Fall Precaution Booklet Issued: No Required Braces or Orthoses: Knee Immobilizer - Right Knee Immobilizer - Right: On except when in CPM Restrictions Weight Bearing Restrictions: Yes RLE Weight Bearing: Weight bearing as tolerated   Pertinent Vitals/Pain HR 132 with activity    ADL  Eating/Feeding: Modified independent Where Assessed - Eating/Feeding: Chair Grooming: Wash/dry hands;Wash/dry face;Teeth care;Min guard (demos fine motor with opening tooth paste and denture cream) Where Assessed - Grooming: Unsupported standing (LOB once with neck extension self corrected) Lower Body Dressing: Moderate assistance Where Assessed - Lower Body Dressing: Supported sitting Toilet Transfer: Minimal assistance Toilet Transfer Method: Sit to Barista: Regular height toilet;Grab bars (pt could benefit from 3n1) Equipment Used: Gait belt;Knee Immobilizer;Rolling walker Transfers/Ambulation Related to ADLs: Pt ambulated to restroom with decreased gait velocity and mod v/c for safety with RW. Pt stating out loud sequence for Rw use.  ADL Comments: Pt supine on arrival and verbalized having some dizziness with HOB increase. Pt  sitting on EOB for extended time to reduce fall risk. Pt sit <>stand reporting dizziness. Pt  with resolving dizziness with static standing for ~2 minutes and weight shifting as a pre gait exercise. pt with increased HR 132 with mobility and diaphoretic. Pt resting on commode for extended 5 minutes due to HR increase. Pt stood at sink to brush teeth, brush hair, clean dentures apply denture cream and wash face. Pt returned to sitting in recliner. Pt educated on CPM use, time and positioning.      OT Diagnosis: Generalized weakness;Acute pain  OT Problem List: Decreased strength;Decreased activity tolerance;Impaired balance (sitting and/or standing);Decreased safety awareness;Decreased knowledge of use of DME or AE;Decreased knowledge of precautions;Pain OT Treatment Interventions: Self-care/ADL training;Energy conservation;Therapeutic activities;Patient/family education;Balance training   OT Goals Acute Rehab OT Goals OT Goal Formulation: With patient Time For Goal Achievement: 02/16/12 Potential to Achieve Goals: Good ADL Goals Pt Will Perform Grooming: with modified independence;Standing at sink ADL Goal: Grooming - Progress: Goal set today Pt Will Perform Lower Body Bathing: with set-up;Sit to stand from chair;with adaptive equipment ADL Goal: Lower Body Bathing - Progress: Goal set today Pt Will Perform Lower Body Dressing: with set-up;Sit to stand from chair;with adaptive equipment ADL Goal: Lower Body Dressing - Progress: Goal set today Pt Will Transfer to Toilet: with modified independence;Ambulation;3-in-1 ADL Goal: Toilet Transfer - Progress: Goal set today Pt Will Perform Toileting - Clothing Manipulation: with modified independence;Sitting on 3-in-1 or toilet ADL Goal: Toileting - Clothing Manipulation - Progress: Goal set today Pt Will Perform Toileting - Hygiene: with modified independence;Sit to stand from 3-in-1/toilet ADL Goal: Toileting - Hygiene - Progress: Goal set today  Visit Information  Last OT Received On: 02/02/12 Assistance Needed: +1    Subjective  Data  Subjective: "I have to wear oxygen at home at night"-  Patient Stated Goal: to  get to walking- I live alone and I am kinda independent. pt reports going to silver sneakers with [employer] and Zumba class. Pt is unable to participate in Zumba prior due to pain   Prior Functioning     Home Living Lives With: Alone Available Help at Discharge: Skilled Nursing Facility Type of Home: Skilled Nursing Facility Additional Comments: pt plans to D/C to SNF for rehab prior to returning home.   Prior Function Level of Independence: Independent Able to Take Stairs?: Yes Driving: Yes Vocation: Retired Musician: No difficulties Dominant Hand: Right         Vision/Perception     Cognition  Overall Cognitive Status: Appears within functional limits for tasks assessed/performed Arousal/Alertness: Awake/alert Orientation Level: Appears intact for tasks assessed Behavior During Session: [facility] for tasks performed    Extremity/Trunk Assessment Right Upper Extremity Assessment RUE ROM/Strength/Tone: Within functional levels RUE Sensation: WFL - Light Touch RUE Coordination: WFL - gross/fine motor Left Upper Extremity Assessment LUE ROM/Strength/Tone: Within functional levels LUE Sensation: WFL - Light Touch LUE Coordination: WFL - gross/fine motor Trunk Assessment Trunk Assessment: Normal     Mobility Bed Mobility Supine to Sit: 5: Supervision;HOB elevated (HOb 20 ) Sitting - Scoot to Edge of Bed: 5: Supervision Details for Bed Mobility Assistance: pt able to move to EOB with no assistance  Transfers Sit to Stand: With upper extremity assist;4: Min assist;From bed (MIn (A) due to Posterior lob) Stand to Sit: 4: Min guard;With upper extremity assist;To chair/3-in-1 Details for Transfer Assistance: pt demonstrates LOB posteriorly standing during session. Pt using LT LE braced against bed as a compensatory strategy     Shoulder Instructions     Exercise     Balance  Static Standing Balance Static Standing - Balance Support: No upper extremity supported;During functional activity Static Standing - Level of Assistance: 5: Stand by assistance   End of Session OT - End of Session Activity Tolerance: Patient tolerated treatment well Patient left: in chair;with call bell/phone within reach Nurse Communication: Mobility status  GO     Harrel Carina [facility] 02/02/2012, 10:43 AM Pager: [phone]

## 2012-02-02 NOTE — Progress Notes (Signed)
Physical Therapy Treatment Patient Details Name: Kelli Rodriguez MRN: [id number] DOB: 1934/12/18 Today's Date: 02/02/2012 Time: 4098-1191 PT Time Calculation (min): 24 min  PT Assessment / Plan / Recommendation Comments on Treatment Session  Patient progressing slowly this AM. Stated tired from working with OT and feels "foggy" due to pain meds. Will attempt another PT session this afternoon.     Follow Up Recommendations  SNF     Does the patient have the potential to tolerate intense rehabilitation     Barriers to Discharge        Equipment Recommendations  Rolling walker with 5" wheels;Other (comment) (3n1)    Recommendations for Other Services    Frequency 7X/week   Plan Discharge plan remains appropriate;Frequency remains appropriate    Precautions / Restrictions Precautions Precautions: Knee;Fall Precaution Booklet Issued: No Required Braces or Orthoses: Knee Immobilizer - Right Knee Immobilizer - Right: On except when in CPM Restrictions Weight Bearing Restrictions: Yes RLE Weight Bearing: Weight bearing as tolerated   Pertinent Vitals/Pain     Mobility  Bed Mobility Bed Mobility: Not assessed Supine to Sit: 5: Supervision;HOB elevated (HOb 20 ) Sitting - Scoot to Edge of Bed: 5: Supervision Details for Bed Mobility Assistance: pt able to move to EOB with no assistance  Transfers Sit to Stand: 4: Min assist;With upper extremity assist;From chair/3-in-1 Stand to Sit: 4: Min guard;With upper extremity assist;To chair/3-in-1 Details for Transfer Assistance: A to ensure balance with stand as patient needs assistance with anterior weight shift. Cues for best technique Ambulation/Gait Ambulation/Gait Assistance: 4: Min guard Ambulation Distance (Feet): 30 Feet Assistive device: Rolling walker Ambulation/Gait Assistance Details: Cues for posture, gait sequence and safe postioning of RW. Patient fatiquing quickly and needing to sit Gait Pattern: Step-to pattern      Exercises Total Joint Exercises Ankle Circles/Pumps: AROM;Both;10 reps Quad Sets: AROM;Right;10 reps Heel Slides: AAROM;Right;10 reps Hip ABduction/ADduction: AAROM;Right;10 reps Straight Leg Raises: AAROM;Right;10 reps   PT Diagnosis:    PT Problem List:   PT Treatment Interventions:     PT Goals Acute Rehab PT Goals PT Goal: Sit to Stand - Progress: Progressing toward goal PT Goal: Stand to Sit - Progress: Progressing toward goal PT Goal: Ambulate - Progress: Progressing toward goal PT Goal: Perform Home Exercise Program - Progress: Progressing toward goal  Visit Information  Last PT Received On: 02/02/12 Assistance Needed: +1    Subjective Data      Cognition  Overall Cognitive Status: Appears within functional limits for tasks assessed/performed Arousal/Alertness: Awake/alert Orientation Level: Appears intact for tasks assessed Behavior During Session: [facility] for tasks performed    Balance  Static Standing Balance Static Standing - Balance Support: No upper extremity supported;During functional activity Static Standing - Level of Assistance: 5: Stand by assistance  End of Session PT - End of Session Equipment Utilized During Treatment: Gait belt;Right knee immobilizer Activity Tolerance: Patient tolerated treatment well;Patient limited by fatigue Patient left: in chair;with call bell/phone within reach Nurse Communication: Mobility status   GP     Fredrich Birks 02/02/2012, 11:59 AM  02/02/2012 Fredrich Birks PTA [phone] pager [phone] office

## 2012-02-03 ENCOUNTER — Encounter (HOSPITAL_COMMUNITY): Payer: Self-pay | Admitting: Orthopedic Surgery

## 2012-02-03 LAB — CBC
Hemoglobin: 8.4 g/dL — ABNORMAL LOW (ref 12.0–15.0)
MCH: 31.8 pg (ref 26.0–34.0)
RBC: 2.64 MIL/uL — ABNORMAL LOW (ref 3.87–5.11)
WBC: 12.4 10*3/uL — ABNORMAL HIGH (ref 4.0–10.5)

## 2012-02-03 LAB — PROTIME-INR: Prothrombin Time: 19.5 seconds — ABNORMAL HIGH (ref 11.6–15.2)

## 2012-02-03 MED ORDER — WARFARIN SODIUM 2.5 MG PO TABS
2.5000 mg | ORAL_TABLET | Freq: Once | ORAL | Status: AC
  Administered 2012-02-03: 2.5 mg via ORAL
  Filled 2012-02-03: qty 1

## 2012-02-03 NOTE — Progress Notes (Signed)
Physical Therapy Treatment Patient Details Name: Kelli Rodriguez MRN: [id number] DOB: [birth year] Today's Date: 02/03/2012 Time: 4098-1191 PT Time Calculation (min): 25 min  PT Assessment / Plan / Recommendation Comments on Treatment Session  Patient progressing well this session with ambulation and motivated to progress with activity. Continue with current POC while awaiting SNF placement    Follow Up Recommendations  SNF     Does the patient have the potential to tolerate intense rehabilitation     Barriers to Discharge        Equipment Recommendations  Rolling walker with 5" wheels;Other (comment) (3n1)    Recommendations for Other Services    Frequency 7X/week   Plan Discharge plan remains appropriate;Frequency remains appropriate    Precautions / Restrictions Precautions Precautions: Fall;Knee Required Braces or Orthoses: Knee Immobilizer - Right Knee Immobilizer - Right: On except when in CPM Restrictions Weight Bearing Restrictions: Yes RLE Weight Bearing: Weight bearing as tolerated   Pertinent Vitals/Pain     Mobility  Bed Mobility Supine to Sit: 6: Modified independent (Device/Increase time) Sitting - Scoot to Edge of Bed: 6: Modified independent (Device/Increase time) Transfers Sit to Stand: 4: Min guard;With upper extremity assist;From toilet;From chair/3-in-1 Stand to Sit: 4: Min guard;With upper extremity assist;To chair/3-in-1;To toilet Details for Transfer Assistance: Cues for safe hand placement and to control descent onto recliner Ambulation/Gait Ambulation/Gait Assistance: 4: Min guard Ambulation Distance (Feet): 120 Feet Assistive device: Rolling walker Ambulation/Gait Assistance Details: Cues for gait sequence and positioning of RW. Patient with increased tolerance for ambulation this sessoin Gait Pattern: Decreased step length - right;Decreased step length - left;Step-to pattern    Exercises Total Joint Exercises Quad Sets: AROM;Right;15  reps Short Arc Quad: 15 reps;Right;AAROM Heel Slides: AAROM;Right;15 reps Hip ABduction/ADduction: AAROM;15 reps;Right Straight Leg Raises: AAROM;Right;15 reps   PT Diagnosis:    PT Problem List:   PT Treatment Interventions:     PT Goals Acute Rehab PT Goals PT Goal: Supine/Side to Sit - Progress: Met PT Goal: Sit to Stand - Progress: Progressing toward goal PT Goal: Stand to Sit - Progress: Progressing toward goal PT Goal: Ambulate - Progress: Progressing toward goal PT Goal: Perform Home Exercise Program - Progress: Progressing toward goal  Visit Information  Last PT Received On: 02/03/12 Assistance Needed: +1    Subjective Data      Cognition  Overall Cognitive Status: Appears within functional limits for tasks assessed/performed Arousal/Alertness: Awake/alert Orientation Level: Appears intact for tasks assessed Behavior During Session: [facility] for tasks performed    Balance     End of Session PT - End of Session Equipment Utilized During Treatment: Gait belt;Right knee immobilizer Activity Tolerance: Patient tolerated treatment well Patient left: with call bell/phone within reach;in chair Nurse Communication: Mobility status   GP     Fredrich Birks 02/03/2012, 9:44 AM 02/03/2012 Fredrich Birks PTA [phone] pager [phone] office

## 2012-02-03 NOTE — Clinical Social Work Placement (Addendum)
    Clinical Social Work Department CLINICAL SOCIAL WORK PLACEMENT NOTE 02/03/2012  Patient:  Kelli Rodriguez, Kelli Rodriguez  Account Number:  [id number] Admit date:  02/01/2012  Clinical Social Worker:  Hulan Fray  Date/time:  02/03/2012 04:02 PM  Clinical Social Work is seeking post-discharge placement for this patient at the following level of care:   SKILLED NURSING   (*CSW will update this form in Epic as items are completed)   02/03/2012  Patient/family provided with [facility] Department of Clinical Social Work's list of facilities offering this level of care within the geographic area requested by the patient (or if unable, by the patient's family).  02/03/2012  Patient/family informed of their freedom to choose among providers that offer the needed level of care, that participate in Medicare, Medicaid or managed care program needed by the patient, have an available bed and are willing to accept the patient.  02/03/2012  Patient/family informed of [facility]' ownership interest in [place], as well as of the fact that they are under no obligation to receive care at this facility.  PASARR submitted to EDS on 02/03/2012 PASARR number received from EDS on 02/03/2012  FL2 transmitted to all facilities in geographic area requested by pt/family on  02/03/2012 FL2 transmitted to all facilities within larger geographic area on   Patient informed that his/her managed care company has contracts with or will negotiate with  certain facilities, including the following:     Patient/family informed of bed offers received:  02/03/12 Patient chooses bed at [facility] Physician recommends and patient chooses bed at    Patient to be transferred to [facility] on 02/04/12  Patient to be transferred to facility by Private Vehicle  The following physician request were entered in Epic:   Additional Comments:

## 2012-02-03 NOTE — Clinical Social Work Psychosocial (Addendum)
    Clinical Social Work Department BRIEF PSYCHOSOCIAL ASSESSMENT 02/03/2012  Patient:  Kelli Rodriguez, Kelli Rodriguez     Account Number:  [id number]     Admit date:  02/01/2012  Clinical Social Worker:  Hulan Fray  Date/Time:  02/03/2012 01:19 PM  Referred by:  Physician  Date Referred:  02/01/2012 Referred for  SNF Placement   Other Referral:   Interview type:  Patient Other interview type:    PSYCHOSOCIAL DATA Living Status:  ALONE Admitted from facility:   Level of care:   Primary support name:  [facility] Primary support relationship to patient:  CHILD, ADULT Degree of support available:   supportive    CURRENT CONCERNS Current Concerns  Post-Acute Placement   Other Concerns:    SOCIAL WORK ASSESSMENT / PLAN Clinical Social Worker received referral for SNF placement for patient. CSW introduced self and explained reason for visit. CSW explained SNF process to patient and provided list of SNF's. Per patient she is requesting [facility]. CSW encouraged patient to have an alternative facility as well, in case her preferred facility does not have availability. Patient voiced she understood and picked [facility] as an alternative. Patient's insurance requires prior authorization. CSW will complete FL2 for MD's signature and continue to follow. CSW will update patient when bed offers are received.   Assessment/plan status:  Psychosocial Support/Ongoing Assessment of Needs Other assessment/ plan:   16:01pm CSW sent clinicals into [facility] for prior authorization.   Information/referral to community resources:   SNF packet    PATIENT'S/FAMILY'S RESPONSE TO PLAN OF CARE: Patient is agreeable to SNF at discharge with preference for [facility] if they have availability.

## 2012-02-03 NOTE — Progress Notes (Signed)
ANTICOAGULATION CONSULT NOTE - Follow Up Consult  Pharmacy Consult for Coumadin Indication: VTE prophylaxis  No Known Allergies  Patient Measurements: Height: 5\' 5"  (165.1 cm) Weight: 176 lb 5.9 oz (80 kg) IBW/kg (Calculated) : 57  Heparin Dosing Weight:   Vital Signs: Temp: 98.2 F (36.8 C) (01/02 0527) Temp src: Oral (01/02 0527) BP: 140/62 mmHg (01/02 0527) Pulse Rate: 100  (01/02 0527)  Labs:  Basename 02/03/12 0615 02/02/12 0620 02/01/12 1705  HGB 8.4* 8.9* --  HCT 23.9* 25.3* --  PLT 220 225 --  APTT -- -- --  LABPROT 19.5* 13.7 13.2  INR 1.71* 1.06 1.01  HEPARINUNFRC -- -- --  CREATININE -- 0.60 --  CKTOTAL -- -- --  CKMB -- -- --  TROPONINI -- -- --    Estimated Creatinine Clearance: 63.5 ml/min (by C-G formula based on Cr of 0.6).   Medications:  Scheduled:     . [EXPIRED] acetaminophen  1,000 mg Intravenous Q6H  . atorvastatin  10 mg Oral q1800  . coumadin book   Does not apply Once  . docusate sodium  100 mg Oral BID  . hydrochlorothiazide  25 mg Oral Daily  . losartan  100 mg Oral Daily  . metoprolol tartrate  25 mg Oral Daily  . [EXPIRED] morphine   Intravenous Q4H  . [COMPLETED] warfarin  5 mg Oral ONCE-1800  . warfarin   Does not apply Once  . Warfarin - Pharmacist Dosing Inpatient   Does not apply q1800    Assessment: 77yo female s/p R-TKA.  INR 1.71 this AM, after 2 doses of Coumadin.  No bleeding problems noted.   Goal of Therapy:  INR 2-3 Monitor platelets by anticoagulation protocol: Yes   Plan:  1.  Coumadin 2.5mg  today 2.  Daily INR  Celedonio Miyamoto, PharmD, BCPS Clinical Pharmacist Pager [phone]

## 2012-02-04 LAB — CBC
Hemoglobin: 7.9 g/dL — ABNORMAL LOW (ref 12.0–15.0)
MCH: 32 pg (ref 26.0–34.0)
Platelets: 230 10*3/uL (ref 150–400)
RBC: 2.47 MIL/uL — ABNORMAL LOW (ref 3.87–5.11)
WBC: 10.3 10*3/uL (ref 4.0–10.5)

## 2012-02-04 LAB — PROTIME-INR: Prothrombin Time: 19.5 seconds — ABNORMAL HIGH (ref 11.6–15.2)

## 2012-02-04 MED ORDER — METHOCARBAMOL 500 MG PO TABS: 500.0000 mg | ORAL_TABLET | Freq: Four times a day (QID) | ORAL | Status: DC | PRN

## 2012-02-04 MED ORDER — WARFARIN SODIUM 2.5 MG PO TABS
2.5000 mg | ORAL_TABLET | Freq: Once | ORAL | Status: DC
  Filled 2012-02-04: qty 1

## 2012-02-04 MED ORDER — WARFARIN SODIUM 2.5 MG PO TABS: 2.5000 mg | ORAL_TABLET | Freq: Once | ORAL | Status: DC

## 2012-02-04 MED ORDER — DSS 100 MG PO CAPS: 100.0000 mg | ORAL_CAPSULE | Freq: Two times a day (BID) | ORAL | Status: DC

## 2012-02-04 MED ORDER — HYDROCODONE-ACETAMINOPHEN 5-325 MG PO TABS: 1.0000 | ORAL_TABLET | ORAL | Status: DC | PRN

## 2012-02-04 NOTE — Progress Notes (Signed)
Clinical Social Worker spoke with patient and she reported that family will transport her to [facility]. CSW spoke with Paula Compton, representative for [facility] and she will be meeting with patient around 11am to complete admission paperwork. CSW spoke with Cedric from [facility] and reported that we have the authorization to send her to SNF. CSW will complete discharge packet and give to family to take to [facility]. CSW will facilitate discharge once discharge summary is completed.   Rozetta Nunnery MSW, [employer] [phone]

## 2012-02-04 NOTE — Discharge Summary (Signed)
Physician Discharge Summary  Patient ID: Kelli Rodriguez MRN: [id number] DOB/AGE: 1934-03-19 78 y.o.  Admit date: 02/01/2012 Discharge date: 02/04/2012  Admission Diagnoses:  Right knee arthritis  Discharge Diagnoses:  Same  Surgeries: Procedure(s): TOTAL KNEE ARTHROPLASTY on 02/01/2012   Consultants:    Discharged Condition: Stable  Hospital Course: Kelli Rodriguez is an 78 y.o. female who was admitted 02/01/2012 with a chief complaint of right knee pain, and found to have a diagnosis of knee arthritis..  They were brought to the operating room on 02/01/2012 and underwent the above named procedures.She tolerated PT well and was ambulatory in halls on post  Op day 3. - Incision intact at that time. Post op ua neg also.    Antibiotics given:  Anti-infectives     Start     Dose/Rate Route Frequency Ordered Stop   02/01/12 2000   ciprofloxacin (CIPRO) IVPB 400 mg  Status:  Discontinued        400 mg 200 mL/hr over 60 Minutes Intravenous Every 12 hours 02/01/12 0749 02/01/12 1230   02/01/12 1400   ceFAZolin (ANCEF) IVPB 2 g/50 mL premix        2 g 100 mL/hr over 30 Minutes Intravenous Every 6 hours 02/01/12 1230 02/01/12 2050   02/01/12 0800   ciprofloxacin (CIPRO) IVPB 400 mg  Status:  Discontinued        400 mg 200 mL/hr over 60 Minutes Intravenous To Surgery 02/01/12 0746 02/01/12 1211   01/31/12 1326   ceFAZolin (ANCEF) IVPB 2 g/50 mL premix        2 g 100 mL/hr over 30 Minutes Intravenous 60 min pre-op 01/31/12 1326 02/01/12 0800        .  Recent vital signs:  Filed Vitals:   02/04/12 1032  BP: 130/59  Pulse: 102  Temp: 98.7 F (37.1 C)  Resp: 20    Recent laboratory studies:  Results for orders placed during the hospital encounter of 02/01/12  PROTIME-INR      Component Value Range   Prothrombin Time 13.2  11.6 - 15.2 seconds   INR 1.01  0.00 - 1.49  PROTIME-INR      Component Value Range   Prothrombin Time 13.7  11.6 - 15.2 seconds   INR 1.06   0.00 - 1.49  CBC      Component Value Range   WBC 10.5  4.0 - 10.5 K/uL   RBC 2.80 (*) 3.87 - 5.11 MIL/uL   Hemoglobin 8.9 (*) 12.0 - 15.0 g/dL   HCT 40.9 (*) 81.1 - 91.4 %   MCV 90.4  78.0 - 100.0 fL   MCH 31.8  26.0 - 34.0 pg   MCHC 35.2  30.0 - 36.0 g/dL   RDW 78.2  95.6 - 21.3 %   Platelets 225  150 - 400 K/uL  BASIC METABOLIC PANEL      Component Value Range   Sodium 133 (*) 135 - 145 mEq/L   Potassium 3.5  3.5 - 5.1 mEq/L   Chloride 97  96 - 112 mEq/L   CO2 27  19 - 32 mEq/L   Glucose, Bld 129 (*) 70 - 99 mg/dL   BUN 11  6 - 23 mg/dL   Creatinine, Ser 0.86  0.50 - 1.10 mg/dL   Calcium 8.5  8.4 - 57.8 mg/dL   GFR calc non Af Amer 87 (*) >90 mL/min   GFR calc Af Amer >90  >90 mL/min  URINALYSIS, ROUTINE W REFLEX MICROSCOPIC  Component Value Range   Color, Urine YELLOW  YELLOW   APPearance CLEAR  CLEAR   Specific Gravity, Urine 1.021  1.005 - 1.030   pH 5.0  5.0 - 8.0   Glucose, UA NEGATIVE  NEGATIVE mg/dL   Hgb urine dipstick NEGATIVE  NEGATIVE   Bilirubin Urine NEGATIVE  NEGATIVE   Ketones, ur NEGATIVE  NEGATIVE mg/dL   Protein, ur NEGATIVE  NEGATIVE mg/dL   Urobilinogen, UA 0.2  0.0 - 1.0 mg/dL   Nitrite NEGATIVE  NEGATIVE   Leukocytes, UA TRACE (*) NEGATIVE  URINE MICROSCOPIC-ADD ON      Component Value Range   WBC, UA 0-2  <3 WBC/hpf  PROTIME-INR      Component Value Range   Prothrombin Time 19.5 (*) 11.6 - 15.2 seconds   INR 1.71 (*) 0.00 - 1.49  CBC      Component Value Range   WBC 12.4 (*) 4.0 - 10.5 K/uL   RBC 2.64 (*) 3.87 - 5.11 MIL/uL   Hemoglobin 8.4 (*) 12.0 - 15.0 g/dL   HCT 16.1 (*) 09.6 - 04.5 %   MCV 90.5  78.0 - 100.0 fL   MCH 31.8  26.0 - 34.0 pg   MCHC 35.1  30.0 - 36.0 g/dL   RDW 40.9  81.1 - 91.4 %   Platelets 220  150 - 400 K/uL  PROTIME-INR      Component Value Range   Prothrombin Time 19.5 (*) 11.6 - 15.2 seconds   INR 1.71 (*) 0.00 - 1.49  CBC      Component Value Range   WBC 10.3  4.0 - 10.5 K/uL   RBC 2.47 (*) 3.87 -  5.11 MIL/uL   Hemoglobin 7.9 (*) 12.0 - 15.0 g/dL   HCT 78.2 (*) 95.6 - 21.3 %   MCV 89.5  78.0 - 100.0 fL   MCH 32.0  26.0 - 34.0 pg   MCHC 35.7  30.0 - 36.0 g/dL   RDW 08.6  57.8 - 46.9 %   Platelets 230  150 - 400 K/uL    Discharge Medications:     Medication List     As of 02/04/2012 11:00 AM    STOP taking these medications         ALEVE 220 MG tablet   Generic drug: naproxen sodium      TAKE these medications         DSS 100 MG Caps   Take 100 mg by mouth 2 (two) times daily.      hydrochlorothiazide 25 MG tablet   Commonly known as: HYDRODIURIL   Take 25 mg by mouth daily.      HYDROcodone-acetaminophen 5-325 MG per tablet   Commonly known as: NORCO/VICODIN   Take 1-2 tablets by mouth every 4 (four) hours as needed (breakthrough pain).      LORazepam 0.5 MG tablet   Commonly known as: ATIVAN   Take 0.5 mg by mouth at bedtime as needed.      losartan 100 MG tablet   Commonly known as: COZAAR   Take 100 mg by mouth daily.      methocarbamol 500 MG tablet   Commonly known as: ROBAXIN   Take 1 tablet (500 mg total) by mouth every 6 (six) hours as needed.      metoprolol tartrate 25 MG tablet   Commonly known as: LOPRESSOR   Take 25 mg by mouth daily.      rosuvastatin 10 MG tablet  Commonly known as: CRESTOR   Take 10 mg by mouth every other day.      warfarin 2.5 MG tablet   Commonly known as: COUMADIN   Take 1 tablet (2.5 mg total) by mouth one time only at 6 PM.        Diagnostic Studies: Dg Chest 2 View  01/24/2012  *RADIOLOGY REPORT*  Clinical Data: Preop knee replacement  CHEST - 2 VIEW  Comparison: 07/14/2006  Findings: Mild hyperinflation. Lungs clear.  Heart size and pulmonary vascularity normal.  No effusion.  Visualized bones unremarkable.  IMPRESSION: No acute disease   Original Report Authenticated By: D. Andria Rhein, MD     Disposition:       Discharge Orders    Future Orders Please Complete By Expires   Diet - low sodium heart  healthy      Call MD / Call 911      Comments:   If you experience chest pain or shortness of breath, CALL 911 and be transported to the hospital emergency room.  If you develope a fever above 101 F, pus (white drainage) or increased drainage or redness at the wound, or calf pain, call your surgeon's office.   Constipation Prevention      Comments:   Drink plenty of fluids.  Prune juice may be helpful.  You may use a stool softener, such as Colace (over the counter) 100 mg twice a day.  Use MiraLax (over the counter) for constipation as needed.   Increase activity slowly as tolerated      Discharge instructions      Comments:   1. Weight bearing as tolerated with walker 2. Keep incision dry. 3. CPM and ROM with PT daily 4. Follow up 02/16/2012         Signed: Rise Paganini SCOTT 02/04/2012, 11:00 AM

## 2012-02-04 NOTE — Progress Notes (Signed)
Patient's purse found in room after discharge.  [facility] notified and will tell patient and family when arrives.  Son also called and message left.  Purse stored in cabinet at [facility] nurses station.

## 2012-02-04 NOTE — Progress Notes (Signed)
Subjective: Pt stable - pain controlled - for dc today - incision ok   Objective: Vital signs in last 24 hours: Temp:  [98.7 F (37.1 C)-99.6 F (37.6 C)] 98.7 F (37.1 C) (01/03 1032) Pulse Rate:  [94-104] 102  (01/03 1032) Resp:  [17-20] 20  (01/03 1032) BP: (130-162)/(56-70) 130/59 mmHg (01/03 1032) SpO2:  [92 %-95 %] 92 % (01/03 1032)  Intake/Output from previous day: 01/02 0701 - 01/03 0700 In: 360 [P.O.:360] Out: 900 [Urine:900] Intake/Output this shift:    Exam:  Neurovascular intact Sensation intact distally Intact pulses distally Dorsiflexion/Plantar flexion intact  Labs:  Basename 02/04/12 0600 02/03/12 0615 02/02/12 0620  HGB 7.9* 8.4* 8.9*    Basename 02/04/12 0600 02/03/12 0615  WBC 10.3 12.4*  RBC 2.47* 2.64*  HCT 22.1* 23.9*  PLT 230 220    Basename 02/02/12 0620  NA 133*  K 3.5  CL 97  CO2 27  BUN 11  CREATININE 0.60  GLUCOSE 129*  CALCIUM 8.5    Basename 02/04/12 0600 02/03/12 0615  LABPT -- --  INR 1.71* 1.71*    Assessment/Plan: Pt doing well - no stairs yet - for tx to snf today - all dc materials completed   Kelli Rodriguez 02/04/2012, 10:36 AM

## 2012-02-04 NOTE — Op Note (Signed)
NAMEVICTORINE, MCNEE NO.:  [id number]  MEDICAL RECORD NO.:  [id number]  LOCATION:                                 FACILITY:  PHYSICIAN:  Burnard Bunting, M.D.    DATE OF BIRTH:  [birth year]  DATE OF PROCEDURE: DATE OF DISCHARGE:                              OPERATIVE REPORT   PREOPERATIVE DIAGNOSIS:  Right knee osteoarthritis.  POSTOPERATIVE DIAGNOSIS:  Right knee osteoarthritis.  PROCEDURE:  Right total knee replacement using Stryker Triathlon posterior cruciate sacrificing knee, 4 femur, 4 tibia, 11 insert, 33 patella.  SURGEON:  Burnard Bunting, M.D.  ASSISTANT:  Wende Neighbors, P.A.  ANESTHESIA:  General endotracheal.  TOTAL BLOOD LOSS:  100 mL.  DRAINS:  None.  TOURNIQUET TIME:  91 minute at 300 mmHg.  INDICATIONS:  Richelle Glick is a patient with end-stage right knee arthritis refractory to nonoperative management, who presents for surgical intervention after explanation of risks and benefits.  PROCEDURE IN DETAIL:  The patient was brought to the operating room where general endotracheal anesthesia was induced.  Preoperative antibiotics were administered.  Time-out was called.  Right leg was prescrubbed with alcohol and Betadine, which was allowed to air dry, prepped with DuraPrep solution, and draped in a sterile manner.  She did have a fake nail over chronic onychomycotic right toe.  No purulent drainage was noted from the right toe, but the artificial nail was removed from its adhesive to the underlying nail bed.  Collier Flowers was used to cover the operative field.  Leg was elevated and exsanguinated with Esmarch wrap.  Tourniquet was inflated.  Anterior approach to the knee was made.  Skin and subcutaneous tissue was sharply divided.  Precise location of the median parapatellar arthrotomy was marked with #1 Vicryl suture.  The fat pad was partially excised.  Lateral patellofemoral ligament was released.  Soft tissue on the distal anterior femur  was removed.  Tibia was cut first 9 mm off the least affected lateral tibial plateau.  Posterior structures and collaterals were protected.  The distal femoral cut was then made 10 mm, which did allow full extension with a 9-mm insert.  Chamfer and box cut was then made.  Tibia was keel punched.  The patella was then prepared by resecting of 22-13.  33 patellar button was placed.  Trial components were placed on the femur and tibia and patient achieved full extension and had good flexion and good stability to varus and valgus stress at 0, 30 and 90 degrees. Trial components were removed.  True components placed with the exception of increasing the trial component insert to an 11 with the true 4 femur, 4 tibia and 33 patellar components.  The 11-mm insert then allowed full extension with good varus and valgus alignment and full flexion.  This was intermittently more stable to varus and valgus at 0 degrees and the 9 mm, and thus having achieved full extension.  I was elected to proceed using this implant.  Following implantation, excess cement was removed.  True component spacer was placed with same stability parameters were maintained.  Patella had excellent tracking with no-thumbs technique.  Tourniquet was then released.  Bleeding points  were encountered, controlled with electrocautery.  A thorough irrigation was again performed.  Arthrotomy was closed using #1 Vicryl suture followed by interrupted inverted 0 Vicryl suture, 2-0 Vicryl suture and skin staples.  The patient did have a preoperative knee block and thus no Marcaine, morphine was injected.  The patient tolerated the procedure well without immediate complications.  Velna Hatchet Vernon's assistance had required all times during the case for retraction, opening and closing.  Her assistance was a medical necessity.     Burnard Bunting, M.D.     GSD/MEDQ  D:  02/01/2012  T:  02/01/2012  Job:  [id number]

## 2012-02-04 NOTE — Progress Notes (Signed)
ANTICOAGULATION CONSULT NOTE - Follow Up Consult  Pharmacy Consult for Coumadin Indication: VTE prophylaxis  No Known Allergies  Patient Measurements: Height: 5\' 5"  (165.1 cm) Weight: 176 lb 5.9 oz (80 kg) IBW/kg (Calculated) : 57  Heparin Dosing Weight:   Vital Signs: Temp: 99.4 F (37.4 C) (01/03 0523) Temp src: Oral (01/03 0523) BP: 143/56 mmHg (01/03 0523) Pulse Rate: 104  (01/03 0523)  Labs:  Basename 02/04/12 0600 02/03/12 0615 02/02/12 0620  HGB 7.9* 8.4* --  HCT 22.1* 23.9* 25.3*  PLT 230 220 225  APTT -- -- --  LABPROT 19.5* 19.5* 13.7  INR 1.71* 1.71* 1.06  HEPARINUNFRC -- -- --  CREATININE -- -- 0.60  CKTOTAL -- -- --  CKMB -- -- --  TROPONINI -- -- --    Estimated Creatinine Clearance: 63.5 ml/min (by C-G formula based on Cr of 0.6).   Medications:  Scheduled:     . atorvastatin  10 mg Oral q1800  . coumadin book   Does not apply Once  . docusate sodium  100 mg Oral BID  . hydrochlorothiazide  25 mg Oral Daily  . losartan  100 mg Oral Daily  . metoprolol tartrate  25 mg Oral Daily  . [COMPLETED] warfarin  2.5 mg Oral ONCE-1800  . warfarin   Does not apply Once  . Warfarin - Pharmacist Dosing Inpatient   Does not apply q1800    Assessment: 78yo female s/p R-TKA.  INR 1.71 this AM, after 3 doses of Coumadin.  No bleeding problems noted.   Goal of Therapy:  INR 2-3 Monitor platelets by anticoagulation protocol: Yes   Plan:  1.  Coumadin 2.5mg  today 2.  Daily INR  Celedonio Miyamoto, PharmD, BCPS Clinical Pharmacist Pager [phone]

## 2012-02-04 NOTE — Progress Notes (Signed)
Patient discharged to [facility].  Vital signs stable, states pain relief with PO pain meds. Report called to [facility].  Son transporting patient to SNF.  Discharged per wheelchair with Nurse tech assisting.

## 2012-02-04 NOTE — Progress Notes (Signed)
Physical Therapy Treatment Patient Details Name: Kelli Rodriguez MRN: [id number] DOB: April 09, 1934 Today's Date: 02/04/2012 Time: 8469-6295 PT Time Calculation (min): 17 min  PT Assessment / Plan / Recommendation Comments on Treatment Session  Patient progressing well. Able to increase ambulation a little bit, becoming more efficient with overall mobility    Follow Up Recommendations  SNF     Does the patient have the potential to tolerate intense rehabilitation     Barriers to Discharge        Equipment Recommendations  Rolling walker with 5" wheels;Other (comment) (3n1)    Recommendations for Other Services    Frequency 7X/week   Plan Discharge plan remains appropriate;Frequency remains appropriate    Precautions / Restrictions Precautions Precautions: Fall;Knee Required Braces or Orthoses: Knee Immobilizer - Right Knee Immobilizer - Right: On except when in CPM Restrictions RLE Weight Bearing: Weight bearing as tolerated   Pertinent Vitals/Pain     Mobility  Bed Mobility Bed Mobility: Not assessed Transfers Sit to Stand: 5: Supervision;With upper extremity assist;From chair/3-in-1 Stand to Sit: 5: Supervision;With upper extremity assist;To chair/3-in-1 Ambulation/Gait Ambulation/Gait Assistance: 5: Supervision Ambulation Distance (Feet): 130 Feet Assistive device: Rolling walker Ambulation/Gait Assistance Details: Cues for posture and to smooth out gait sequence Gait Pattern: Step-to pattern;Trunk flexed    Exercises Total Joint Exercises Quad Sets: AROM;Right;15 reps Heel Slides: AAROM;Right;15 reps Hip ABduction/ADduction: AAROM;Right;15 reps Straight Leg Raises: AAROM;Right;15 reps   PT Diagnosis:    PT Problem List:   PT Treatment Interventions:     PT Goals Acute Rehab PT Goals PT Goal: Sit to Stand - Progress: Progressing toward goal PT Goal: Stand to Sit - Progress: Progressing toward goal PT Goal: Ambulate - Progress: Progressing toward goal PT  Goal: Perform Home Exercise Program - Progress: Progressing toward goal  Visit Information  Last PT Received On: 02/04/12 Assistance Needed: +1    Subjective Data      Cognition  Overall Cognitive Status: Appears within functional limits for tasks assessed/performed Arousal/Alertness: Awake/alert Orientation Level: Appears intact for tasks assessed Behavior During Session: [facility] for tasks performed    Balance     End of Session PT - End of Session Equipment Utilized During Treatment: Gait belt;Right knee immobilizer Activity Tolerance: Patient tolerated treatment well Patient left: in chair;with call bell/phone within reach Nurse Communication: Mobility status   GP     Fredrich Birks 02/04/2012, 9:26 AM 02/04/2012 Fredrich Birks PTA [phone] pager [phone] office

## 2012-02-29 ENCOUNTER — Ambulatory Visit: Payer: Medicare Other | Admitting: Physical Therapy

## 2012-03-02 ENCOUNTER — Encounter: Payer: Medicare Other | Admitting: Physical Therapy

## 2012-03-06 ENCOUNTER — Ambulatory Visit: Payer: Medicare Other | Attending: Orthopedic Surgery | Admitting: Physical Therapy

## 2012-03-06 DIAGNOSIS — M25669 Stiffness of unspecified knee, not elsewhere classified: Secondary | ICD-10-CM | POA: Insufficient documentation

## 2012-03-06 DIAGNOSIS — Z96659 Presence of unspecified artificial knee joint: Secondary | ICD-10-CM | POA: Insufficient documentation

## 2012-03-06 DIAGNOSIS — M25569 Pain in unspecified knee: Secondary | ICD-10-CM | POA: Insufficient documentation

## 2012-03-06 DIAGNOSIS — R5381 Other malaise: Secondary | ICD-10-CM | POA: Insufficient documentation

## 2012-03-06 DIAGNOSIS — IMO0001 Reserved for inherently not codable concepts without codable children: Secondary | ICD-10-CM | POA: Insufficient documentation

## 2012-03-13 ENCOUNTER — Other Ambulatory Visit: Payer: Self-pay | Admitting: Orthopedic Surgery

## 2012-03-13 ENCOUNTER — Ambulatory Visit
Admission: RE | Admit: 2012-03-13 | Discharge: 2012-03-13 | Disposition: A | Discharge status: Home / Self Care | Payer: Medicare Other | Source: Ambulatory Visit | Attending: Orthopedic Surgery | Admitting: Orthopedic Surgery

## 2012-03-13 DIAGNOSIS — M7989 Other specified soft tissue disorders: Secondary | ICD-10-CM

## 2012-03-13 DIAGNOSIS — M79661 Pain in right lower leg: Secondary | ICD-10-CM

## 2012-03-14 ENCOUNTER — Ambulatory Visit: Payer: Medicare Other | Admitting: Physical Therapy

## 2012-03-15 ENCOUNTER — Encounter: Payer: Medicare Other | Admitting: Physical Therapy

## 2012-03-20 ENCOUNTER — Ambulatory Visit: Payer: Medicare Other | Admitting: Physical Therapy

## 2012-03-21 ENCOUNTER — Ambulatory Visit: Payer: Medicare Other | Admitting: Physical Therapy

## 2012-03-27 ENCOUNTER — Ambulatory Visit: Payer: Medicare Other | Admitting: Physical Therapy

## 2012-03-29 ENCOUNTER — Ambulatory Visit: Payer: Medicare Other | Admitting: Physical Therapy

## 2012-03-30 ENCOUNTER — Ambulatory Visit: Payer: Medicare Other | Admitting: Physical Therapy

## 2012-04-03 ENCOUNTER — Encounter: Payer: Medicare Other | Admitting: Physical Therapy

## 2012-04-05 ENCOUNTER — Encounter: Payer: Medicare Other | Admitting: Physical Therapy

## 2012-04-07 ENCOUNTER — Encounter: Payer: Medicare Other | Admitting: Physical Therapy

## 2012-04-20 ENCOUNTER — Other Ambulatory Visit (HOSPITAL_COMMUNITY): Payer: Self-pay | Admitting: Obstetrics and Gynecology

## 2012-05-09 ENCOUNTER — Ambulatory Visit (HOSPITAL_COMMUNITY)
Admission: RE | Admit: 2012-05-09 | Discharge: 2012-05-09 | Disposition: A | Discharge status: Home / Self Care | Payer: Medicare Other | Source: Ambulatory Visit | Attending: Obstetrics and Gynecology | Admitting: Obstetrics and Gynecology

## 2012-05-09 DIAGNOSIS — Z1231 Encounter for screening mammogram for malignant neoplasm of breast: Secondary | ICD-10-CM | POA: Insufficient documentation

## 2012-05-09 DIAGNOSIS — Z139 Encounter for screening, unspecified: Secondary | ICD-10-CM

## 2012-07-13 ENCOUNTER — Other Ambulatory Visit: Payer: Self-pay | Admitting: Orthopedic Surgery

## 2012-08-07 ENCOUNTER — Encounter (HOSPITAL_COMMUNITY): Payer: Self-pay | Admitting: Pharmacy Technician

## 2012-08-11 ENCOUNTER — Encounter (HOSPITAL_COMMUNITY)
Admission: RE | Admit: 2012-08-11 | Discharge: 2012-08-11 | Disposition: A | Discharge status: Home / Self Care | Payer: Medicare Other | Source: Ambulatory Visit | Attending: Orthopedic Surgery | Admitting: Orthopedic Surgery

## 2012-08-11 ENCOUNTER — Encounter (HOSPITAL_COMMUNITY): Payer: Self-pay

## 2012-08-11 DIAGNOSIS — Z01818 Encounter for other preprocedural examination: Secondary | ICD-10-CM | POA: Insufficient documentation

## 2012-08-11 DIAGNOSIS — Z01812 Encounter for preprocedural laboratory examination: Secondary | ICD-10-CM | POA: Insufficient documentation

## 2012-08-11 HISTORY — DX: Anemia, unspecified: D64.9

## 2012-08-11 HISTORY — DX: Chronic obstructive pulmonary disease, unspecified: J44.9

## 2012-08-11 HISTORY — DX: Drug induced constipation: K59.03

## 2012-08-11 LAB — BASIC METABOLIC PANEL
GFR calc Af Amer: >90 mL/min (ref >90)
GFR calc non Af Amer: 81 mL/min — ABNORMAL LOW (ref >90)
Glucose, Bld: 99 mg/dL (ref 70–99)
Potassium: 3.8 mEq/L (ref 3.5–5.1)
Sodium: 137 mEq/L (ref 135–145)

## 2012-08-11 LAB — SURGICAL PCR SCREEN: MRSA, PCR: NEGATIVE

## 2012-08-11 LAB — PROTIME-INR
INR: 0.97 (ref 0.00–1.49)
Prothrombin Time: 12.7 seconds (ref 11.6–15.2)

## 2012-08-11 LAB — CBC
Hemoglobin: 11.8 g/dL — ABNORMAL LOW (ref 12.0–15.0)
RBC: 3.8 MIL/uL — ABNORMAL LOW (ref 3.87–5.11)

## 2012-08-11 LAB — TYPE AND SCREEN
ABO/RH(D): O POS
Antibody Screen: NEGATIVE

## 2012-08-11 NOTE — Pre-Procedure Instructions (Signed)
Kelli Rodriguez  08/11/2012  Your procedure is scheduled on:  08-22-2012  Tuesday @ 7:30 AM   Report to [facility] at 5:30 AM. Check in @ Admitting   Call this number if you have problems the morning of surgery: [phone]   Remember:   Do not eat food or drink liquids after midnight   Take these medicines the morning of surgery with A SIP OF WATER: pain medication as needed,   Do not wear jewelry, make-up or nail polish.  Do not wear lotions, powders, or perfumes. .  Do not shave 48 hours prior to surgery.   Do not bring valuables to the hospital.   Contacts, dentures or bridgework may not be worn into surgery.  Leave suitcase in the car. After surgery it may be brought to your room.   For patients admitted to the hospital, checkout time is 11:00 AM the day of discharge.   Patients discharged the day of surgery will not be allowed to drive home.    Special Instructions: Shower using CHG 2 nights before surgery and the night before surgery.  If you shower the day of surgery use CHG.  Use special wash - you have one bottle of CHG for all showers.  You should use approximately 1/3 of the bottle for each shower.   Please read over the following fact sheets that you were given: Pain Booklet, Coughing and Deep Breathing, Blood Transfusion Information and Surgical Site Infection Prevention

## 2012-08-11 NOTE — Progress Notes (Signed)
Dr. Diamantina Providence office called to request orders,also notified of positive PCR (Staph) and prescription needs to be called to [facility] in [place].

## 2012-08-21 MED ORDER — CEFAZOLIN SODIUM-DEXTROSE 2-3 GM-% IV SOLR
2.0000 g | INTRAVENOUS | Status: AC
  Administered 2012-08-22: 2 g via INTRAVENOUS
  Filled 2012-08-21: qty 50

## 2012-08-22 ENCOUNTER — Encounter (HOSPITAL_COMMUNITY): Payer: Self-pay | Admitting: Anesthesiology

## 2012-08-22 ENCOUNTER — Encounter (HOSPITAL_COMMUNITY)
Admission: RE | Disposition: A | Discharge status: Skilled Nursing Facility | Payer: Self-pay | Source: Ambulatory Visit | Attending: Orthopedic Surgery

## 2012-08-22 ENCOUNTER — Inpatient Hospital Stay (HOSPITAL_COMMUNITY): Payer: Medicare Other | Admitting: Anesthesiology

## 2012-08-22 ENCOUNTER — Inpatient Hospital Stay (HOSPITAL_COMMUNITY)
Admission: RE | Admit: 2012-08-22 | Discharge: 2012-08-25 | DRG: 470 | Disposition: A | Discharge status: Skilled Nursing Facility | Payer: Medicare Other | Source: Ambulatory Visit | Attending: Orthopedic Surgery | Admitting: Orthopedic Surgery

## 2012-08-22 ENCOUNTER — Encounter (HOSPITAL_COMMUNITY): Payer: Self-pay | Admitting: Certified Registered Nurse Anesthetist

## 2012-08-22 DIAGNOSIS — Z9089 Acquired absence of other organs: Secondary | ICD-10-CM

## 2012-08-22 DIAGNOSIS — I10 Essential (primary) hypertension: Secondary | ICD-10-CM | POA: Diagnosis present

## 2012-08-22 DIAGNOSIS — Z9851 Tubal ligation status: Secondary | ICD-10-CM

## 2012-08-22 DIAGNOSIS — E785 Hyperlipidemia, unspecified: Secondary | ICD-10-CM | POA: Diagnosis present

## 2012-08-22 DIAGNOSIS — Z9010 Acquired absence of unspecified breast and nipple: Secondary | ICD-10-CM

## 2012-08-22 DIAGNOSIS — Z853 Personal history of malignant neoplasm of breast: Secondary | ICD-10-CM

## 2012-08-22 DIAGNOSIS — I712 Thoracic aortic aneurysm, without rupture, unspecified: Secondary | ICD-10-CM | POA: Diagnosis present

## 2012-08-22 DIAGNOSIS — Z7901 Long term (current) use of anticoagulants: Secondary | ICD-10-CM

## 2012-08-22 DIAGNOSIS — F411 Generalized anxiety disorder: Secondary | ICD-10-CM | POA: Diagnosis present

## 2012-08-22 DIAGNOSIS — M1710 Unilateral primary osteoarthritis, unspecified knee: Secondary | ICD-10-CM

## 2012-08-22 DIAGNOSIS — Z96659 Presence of unspecified artificial knee joint: Secondary | ICD-10-CM

## 2012-08-22 DIAGNOSIS — Z79899 Other long term (current) drug therapy: Secondary | ICD-10-CM

## 2012-08-22 HISTORY — PX: TOTAL KNEE ARTHROPLASTY: SHX125

## 2012-08-22 LAB — URINALYSIS, ROUTINE W REFLEX MICROSCOPIC
Glucose, UA: NEGATIVE mg/dL
Ketones, ur: NEGATIVE mg/dL
pH: 7 (ref 5.0–8.0)

## 2012-08-22 LAB — URINE MICROSCOPIC-ADD ON

## 2012-08-22 SURGERY — ARTHROPLASTY, KNEE, TOTAL
Anesthesia: General | Site: Knee | Laterality: Left | Wound class: Clean

## 2012-08-22 MED ORDER — CLONIDINE HCL (ANALGESIA) 100 MCG/ML EP SOLN
150.0000 ug | EPIDURAL | Status: DC
  Filled 2012-08-22: qty 1.5

## 2012-08-22 MED ORDER — BUPIVACAINE HCL (PF) 0.25 % IJ SOLN
INTRAMUSCULAR | Status: DC | PRN
  Administered 2012-08-22: 30 mL

## 2012-08-22 MED ORDER — SODIUM CHLORIDE 0.9 % IR SOLN
Status: DC | PRN
  Administered 2012-08-22: 3000 mL

## 2012-08-22 MED ORDER — LACTATED RINGERS IV SOLN
INTRAVENOUS | Status: DC | PRN
  Administered 2012-08-22 (×2): via INTRAVENOUS

## 2012-08-22 MED ORDER — BUPIVACAINE LIPOSOME 1.3 % IJ SUSP
20.0000 mL | Freq: Once | INTRAMUSCULAR | Status: DC
  Filled 2012-08-22: qty 20

## 2012-08-22 MED ORDER — PROPOFOL 10 MG/ML IV BOLUS
INTRAVENOUS | Status: DC | PRN
  Administered 2012-08-22: 170 mg via INTRAVENOUS

## 2012-08-22 MED ORDER — LOSARTAN POTASSIUM 50 MG PO TABS
100.0000 mg | ORAL_TABLET | Freq: Every day | ORAL | Status: DC
  Administered 2012-08-23 – 2012-08-25 (×3): 100 mg via ORAL
  Filled 2012-08-22 (×4): qty 2

## 2012-08-22 MED ORDER — ACETAMINOPHEN 325 MG PO TABS
650.0000 mg | ORAL_TABLET | Freq: Four times a day (QID) | ORAL | Status: DC | PRN
  Administered 2012-08-22: 650 mg via ORAL
  Filled 2012-08-22: qty 2

## 2012-08-22 MED ORDER — SODIUM CHLORIDE 0.9 % IJ SOLN: 9.0000 mL | INTRAMUSCULAR | Status: DC | PRN

## 2012-08-22 MED ORDER — MORPHINE SULFATE (PF) 1 MG/ML IV SOLN
INTRAVENOUS | Status: AC
  Administered 2012-08-22: 5 mg via INTRAVENOUS
  Filled 2012-08-22: qty 25

## 2012-08-22 MED ORDER — NEOSTIGMINE METHYLSULFATE 1 MG/ML IJ SOLN
INTRAMUSCULAR | Status: DC | PRN
  Administered 2012-08-22: 3 mg via INTRAVENOUS

## 2012-08-22 MED ORDER — ACETAMINOPHEN 650 MG RE SUPP: 650.0000 mg | Freq: Four times a day (QID) | RECTAL | Status: DC | PRN

## 2012-08-22 MED ORDER — METOPROLOL TARTRATE 1 MG/ML IV SOLN
INTRAVENOUS | Status: DC | PRN
  Administered 2012-08-22 (×2): 2.5 mg via INTRAVENOUS

## 2012-08-22 MED ORDER — WARFARIN SODIUM 5 MG PO TABS
5.0000 mg | ORAL_TABLET | Freq: Once | ORAL | Status: AC
  Filled 2012-08-22: qty 1

## 2012-08-22 MED ORDER — ATORVASTATIN CALCIUM 10 MG PO TABS
10.0000 mg | ORAL_TABLET | Freq: Every day | ORAL | Status: DC
  Administered 2012-08-22 – 2012-08-24 (×3): 10 mg via ORAL
  Filled 2012-08-22 (×4): qty 1

## 2012-08-22 MED ORDER — ONDANSETRON HCL 4 MG/2ML IJ SOLN: 4.0000 mg | Freq: Four times a day (QID) | INTRAMUSCULAR | Status: DC | PRN

## 2012-08-22 MED ORDER — PHENOL 1.4 % MT LIQD: 1.0000 | OROMUCOSAL | Status: DC | PRN

## 2012-08-22 MED ORDER — METHOCARBAMOL 100 MG/ML IJ SOLN
500.0000 mg | Freq: Four times a day (QID) | INTRAVENOUS | Status: DC | PRN
  Filled 2012-08-22: qty 5

## 2012-08-22 MED ORDER — CHLORHEXIDINE GLUCONATE 4 % EX LIQD: 60.0000 mL | Freq: Once | CUTANEOUS | Status: DC

## 2012-08-22 MED ORDER — BUPIVACAINE HCL (PF) 0.25 % IJ SOLN
INTRAMUSCULAR | Status: AC
  Filled 2012-08-22: qty 30

## 2012-08-22 MED ORDER — ROCURONIUM BROMIDE 100 MG/10ML IV SOLN
INTRAVENOUS | Status: DC | PRN
  Administered 2012-08-22: 50 mg via INTRAVENOUS

## 2012-08-22 MED ORDER — ESMOLOL HCL 10 MG/ML IV SOLN
INTRAVENOUS | Status: DC | PRN
  Administered 2012-08-22: 10 mg via INTRAVENOUS

## 2012-08-22 MED ORDER — 0.9 % SODIUM CHLORIDE (POUR BTL) OPTIME
TOPICAL | Status: DC | PRN
  Administered 2012-08-22 (×2): 1000 mL

## 2012-08-22 MED ORDER — DOCUSATE SODIUM 100 MG PO CAPS
100.0000 mg | ORAL_CAPSULE | Freq: Two times a day (BID) | ORAL | Status: DC
  Administered 2012-08-22 – 2012-08-25 (×7): 100 mg via ORAL
  Filled 2012-08-22 (×7): qty 1

## 2012-08-22 MED ORDER — WARFARIN - PHARMACIST DOSING INPATIENT: Freq: Every day | Status: DC

## 2012-08-22 MED ORDER — METOCLOPRAMIDE HCL 5 MG/ML IJ SOLN: 5.0000 mg | Freq: Three times a day (TID) | INTRAMUSCULAR | Status: DC | PRN

## 2012-08-22 MED ORDER — FENTANYL CITRATE 0.05 MG/ML IJ SOLN
INTRAMUSCULAR | Status: DC | PRN
  Administered 2012-08-22: 100 ug via INTRAVENOUS
  Administered 2012-08-22 (×3): 50 ug via INTRAVENOUS
  Administered 2012-08-22: 25 ug via INTRAVENOUS
  Administered 2012-08-22: 100 ug via INTRAVENOUS
  Administered 2012-08-22 (×2): 50 ug via INTRAVENOUS
  Administered 2012-08-22: 25 ug via INTRAVENOUS

## 2012-08-22 MED ORDER — GLYCOPYRROLATE 0.2 MG/ML IJ SOLN
INTRAMUSCULAR | Status: DC | PRN
  Administered 2012-08-22: 0.4 mg via INTRAVENOUS

## 2012-08-22 MED ORDER — SODIUM CHLORIDE 0.9 % IJ SOLN
INTRAMUSCULAR | Status: DC | PRN
  Administered 2012-08-22: 09:00:00

## 2012-08-22 MED ORDER — PATIENT'S GUIDE TO USING COUMADIN BOOK
Freq: Once | Status: AC
  Administered 2012-08-23: 10:00:00
  Filled 2012-08-22: qty 1

## 2012-08-22 MED ORDER — METOCLOPRAMIDE HCL 10 MG PO TABS: 5.0000 mg | ORAL_TABLET | Freq: Three times a day (TID) | ORAL | Status: DC | PRN

## 2012-08-22 MED ORDER — ONDANSETRON HCL 4 MG/2ML IJ SOLN: 4.0000 mg | Freq: Once | INTRAMUSCULAR | Status: DC | PRN

## 2012-08-22 MED ORDER — HYDROCHLOROTHIAZIDE 25 MG PO TABS
25.0000 mg | ORAL_TABLET | Freq: Every day | ORAL | Status: DC
  Administered 2012-08-23 – 2012-08-25 (×3): 25 mg via ORAL
  Filled 2012-08-22 (×4): qty 1

## 2012-08-22 MED ORDER — WARFARIN VIDEO
Freq: Once | Status: AC
  Administered 2012-08-23: 14:00:00

## 2012-08-22 MED ORDER — MIDAZOLAM HCL 5 MG/5ML IJ SOLN
INTRAMUSCULAR | Status: DC | PRN
  Administered 2012-08-22: 2 mg via INTRAVENOUS

## 2012-08-22 MED ORDER — OXYCODONE HCL 5 MG PO TABS
5.0000 mg | ORAL_TABLET | ORAL | Status: DC | PRN
  Administered 2012-08-23 – 2012-08-25 (×6): 10 mg via ORAL
  Filled 2012-08-22 (×6): qty 2

## 2012-08-22 MED ORDER — LIDOCAINE HCL (CARDIAC) 20 MG/ML IV SOLN
INTRAVENOUS | Status: DC | PRN
  Administered 2012-08-22: 40 mg via INTRAVENOUS

## 2012-08-22 MED ORDER — ONDANSETRON HCL 4 MG/2ML IJ SOLN
INTRAMUSCULAR | Status: DC | PRN
  Administered 2012-08-22: 4 mg via INTRAVENOUS

## 2012-08-22 MED ORDER — DIPHENHYDRAMINE HCL 12.5 MG/5ML PO ELIX: 12.5000 mg | ORAL_SOLUTION | Freq: Four times a day (QID) | ORAL | Status: DC | PRN

## 2012-08-22 MED ORDER — METOPROLOL SUCCINATE ER 25 MG PO TB24
25.0000 mg | ORAL_TABLET | Freq: Every evening | ORAL | Status: DC
  Administered 2012-08-22 – 2012-08-24 (×3): 25 mg via ORAL
  Filled 2012-08-22 (×4): qty 1

## 2012-08-22 MED ORDER — MORPHINE SULFATE 4 MG/ML IJ SOLN
4.0000 mg | INTRAMUSCULAR | Status: DC
  Filled 2012-08-22: qty 1

## 2012-08-22 MED ORDER — DIPHENHYDRAMINE HCL 50 MG/ML IJ SOLN: 12.5000 mg | Freq: Four times a day (QID) | INTRAMUSCULAR | Status: DC | PRN

## 2012-08-22 MED ORDER — WARFARIN SODIUM 5 MG PO TABS
5.0000 mg | ORAL_TABLET | Freq: Once | ORAL | Status: AC
  Administered 2012-08-22: 5 mg via ORAL
  Filled 2012-08-22: qty 1

## 2012-08-22 MED ORDER — POTASSIUM CHLORIDE IN NACL 20-0.9 MEQ/L-% IV SOLN
INTRAVENOUS | Status: DC
  Administered 2012-08-22 – 2012-08-23 (×2): via INTRAVENOUS
  Filled 2012-08-22 (×4): qty 1000

## 2012-08-22 MED ORDER — METHOCARBAMOL 500 MG PO TABS
500.0000 mg | ORAL_TABLET | Freq: Four times a day (QID) | ORAL | Status: DC | PRN
  Administered 2012-08-23 – 2012-08-24 (×4): 500 mg via ORAL
  Filled 2012-08-22 (×4): qty 1

## 2012-08-22 MED ORDER — MENTHOL 3 MG MT LOZG: 1.0000 | LOZENGE | OROMUCOSAL | Status: DC | PRN

## 2012-08-22 MED ORDER — ONDANSETRON HCL 4 MG PO TABS: 4.0000 mg | ORAL_TABLET | Freq: Four times a day (QID) | ORAL | Status: DC | PRN

## 2012-08-22 MED ORDER — CEFAZOLIN SODIUM-DEXTROSE 2-3 GM-% IV SOLR
2.0000 g | Freq: Three times a day (TID) | INTRAVENOUS | Status: AC
  Administered 2012-08-22 (×2): 2 g via INTRAVENOUS
  Filled 2012-08-22 (×2): qty 50

## 2012-08-22 MED ORDER — NALOXONE HCL 0.4 MG/ML IJ SOLN: 0.4000 mg | INTRAMUSCULAR | Status: DC | PRN

## 2012-08-22 MED ORDER — MORPHINE SULFATE (PF) 1 MG/ML IV SOLN
INTRAVENOUS | Status: DC
  Administered 2012-08-22: 7 mg via INTRAVENOUS
  Administered 2012-08-22: 30 mg via INTRAVENOUS
  Administered 2012-08-22 – 2012-08-23 (×3): 2 mg via INTRAVENOUS

## 2012-08-22 MED ORDER — LORAZEPAM 0.5 MG PO TABS: 0.5000 mg | ORAL_TABLET | Freq: Every evening | ORAL | Status: DC | PRN

## 2012-08-22 MED ORDER — HYDROMORPHONE HCL PF 1 MG/ML IJ SOLN: 0.2500 mg | INTRAMUSCULAR | Status: DC | PRN

## 2012-08-22 SURGICAL SUPPLY — 84 items
BANDAGE ELASTIC 4 VELCRO ST LF (GAUZE/BANDAGES/DRESSINGS) ×2 IMPLANT
BANDAGE ESMARK 6X9 LF (GAUZE/BANDAGES/DRESSINGS) ×1 IMPLANT
BLADE SAG 18X100X1.27 (BLADE) ×2 IMPLANT
BLADE SAW SGTL 13.0X1.19X90.0M (BLADE) ×2 IMPLANT
BNDG CMPR 9X6 STRL LF SNTH (GAUZE/BANDAGES/DRESSINGS) ×1
BNDG CMPR MED 10X6 ELC LF (GAUZE/BANDAGES/DRESSINGS) ×2
BNDG COHESIVE 6X5 TAN STRL LF (GAUZE/BANDAGES/DRESSINGS) ×3 IMPLANT
BNDG ELASTIC 6X10 VLCR STRL LF (GAUZE/BANDAGES/DRESSINGS) ×5 IMPLANT
BNDG ESMARK 6X9 LF (GAUZE/BANDAGES/DRESSINGS) ×2
BOWL SMART MIX CTS (DISPOSABLE) ×2 IMPLANT
CEMENT BONE SIMPLEX SPEEDSET (Cement) ×2 IMPLANT
CLOTH BEACON ORANGE TIMEOUT ST (SAFETY) ×2 IMPLANT
COVER SURGICAL LIGHT HANDLE (MISCELLANEOUS) ×2 IMPLANT
CUFF TOURNIQUET SINGLE 34IN LL (TOURNIQUET CUFF) ×1 IMPLANT
CUFF TOURNIQUET SINGLE 44IN (TOURNIQUET CUFF) IMPLANT
DRAPE INCISE IOBAN 66X45 STRL (DRAPES) IMPLANT
DRAPE ORTHO SPLIT 77X108 STRL (DRAPES) ×6
DRAPE SURG ORHT 6 SPLT 77X108 (DRAPES) ×3 IMPLANT
DRAPE U-SHAPE 47X51 STRL (DRAPES) ×2 IMPLANT
DRAPE X-RAY CASS 24X20 (DRAPES) IMPLANT
DRSG PAD ABDOMINAL 8X10 ST (GAUZE/BANDAGES/DRESSINGS) ×4 IMPLANT
DURAPREP 26ML APPLICATOR (WOUND CARE) ×3 IMPLANT
ELECT REM PT RETURN 9FT ADLT (ELECTROSURGICAL) ×2
ELECTRODE REM PT RTRN 9FT ADLT (ELECTROSURGICAL) ×1 IMPLANT
EVACUATOR 1/8 PVC DRAIN (DRAIN) ×2 IMPLANT
FACESHIELD LNG OPTICON STERILE (SAFETY) ×2 IMPLANT
GAUZE XEROFORM 5X9 LF (GAUZE/BANDAGES/DRESSINGS) ×2 IMPLANT
GLOVE BIO SURGEON STRL SZ7 (GLOVE) ×1 IMPLANT
GLOVE BIO SURGEON STRL SZ8 (GLOVE) ×1 IMPLANT
GLOVE BIOGEL PI IND STRL 7.0 (GLOVE) IMPLANT
GLOVE BIOGEL PI IND STRL 7.5 (GLOVE) ×1 IMPLANT
GLOVE BIOGEL PI IND STRL 8 (GLOVE) ×1 IMPLANT
GLOVE BIOGEL PI INDICATOR 7.0 (GLOVE) ×1
GLOVE BIOGEL PI INDICATOR 7.5 (GLOVE) ×1
GLOVE BIOGEL PI INDICATOR 8 (GLOVE) ×2
GLOVE ECLIPSE 7.0 STRL STRAW (GLOVE) ×3 IMPLANT
GLOVE SURG ORTHO 8.0 STRL STRW (GLOVE) ×3 IMPLANT
GOWN PREVENTION PLUS LG XLONG (DISPOSABLE) ×1 IMPLANT
GOWN PREVENTION PLUS XLARGE (GOWN DISPOSABLE) ×2 IMPLANT
GOWN STRL NON-REIN LRG LVL3 (GOWN DISPOSABLE) ×3 IMPLANT
GOWN STRL REIN XL XLG (GOWN DISPOSABLE) ×2 IMPLANT
HANDPIECE INTERPULSE COAX TIP (DISPOSABLE) ×2
HOOD PEEL AWAY FACE SHEILD DIS (HOOD) ×7 IMPLANT
IMMOBILIZER KNEE 20 (SOFTGOODS)
IMMOBILIZER KNEE 20 THIGH 36 (SOFTGOODS) IMPLANT
IMMOBILIZER KNEE 22 UNIV (SOFTGOODS) ×1 IMPLANT
IMMOBILIZER KNEE 24 THIGH 36 (MISCELLANEOUS) IMPLANT
IMMOBILIZER KNEE 24 UNIV (MISCELLANEOUS)
KIT BASIN OR (CUSTOM PROCEDURE TRAY) ×2 IMPLANT
KIT ROOM TURNOVER OR (KITS) ×2 IMPLANT
KNEE/VIT E POLY LINER LEVEL 1B ×1 IMPLANT
MANIFOLD NEPTUNE II (INSTRUMENTS) ×2 IMPLANT
MARKER SPHERE PSV REFLC THRD 5 (MARKER) IMPLANT
NDL 18GX1X1/2 (RX/OR ONLY) (NEEDLE) ×1 IMPLANT
NDL SPNL 18GX3.5 QUINCKE PK (NEEDLE) ×1 IMPLANT
NEEDLE 18GX1X1/2 (RX/OR ONLY) (NEEDLE) ×2 IMPLANT
NEEDLE 22X1 1/2 (OR ONLY) (NEEDLE) ×1 IMPLANT
NEEDLE SPNL 18GX3.5 QUINCKE PK (NEEDLE) ×2 IMPLANT
NS IRRIG 1000ML POUR BTL (IV SOLUTION) ×2 IMPLANT
PACK TOTAL JOINT (CUSTOM PROCEDURE TRAY) ×2 IMPLANT
PAD ARMBOARD 7.5X6 YLW CONV (MISCELLANEOUS) ×3 IMPLANT
PAD CAST 4YDX4 CTTN HI CHSV (CAST SUPPLIES) ×1 IMPLANT
PADDING CAST COTTON 4X4 STRL (CAST SUPPLIES) ×2
PADDING CAST COTTON 6X4 STRL (CAST SUPPLIES) ×5 IMPLANT
PIN SCHANZ 4MM 130MM (PIN) IMPLANT
RUBBERBAND STERILE (MISCELLANEOUS) ×2 IMPLANT
SET HNDPC FAN SPRY TIP SCT (DISPOSABLE) ×1 IMPLANT
SPONGE GAUZE 4X4 12PLY (GAUZE/BANDAGES/DRESSINGS) ×2 IMPLANT
SPONGE LAP 18X18 X RAY DECT (DISPOSABLE) ×2 IMPLANT
STAPLER VISISTAT 35W (STAPLE) ×2 IMPLANT
SUCTION FRAZIER TIP 10 FR DISP (SUCTIONS) ×2 IMPLANT
SUT ETHILON 3 0 PS 1 (SUTURE) ×4 IMPLANT
SUT VIC AB 0 CTB1 27 (SUTURE) ×6 IMPLANT
SUT VIC AB 1 CT1 27 (SUTURE) ×10
SUT VIC AB 1 CT1 27XBRD ANBCTR (SUTURE) ×5 IMPLANT
SUT VIC AB 2-0 CT1 27 (SUTURE) ×4
SUT VIC AB 2-0 CT1 TAPERPNT 27 (SUTURE) ×2 IMPLANT
SYR 20CC LL (SYRINGE) ×2 IMPLANT
SYR 30ML SLIP (SYRINGE) ×2 IMPLANT
SYR TB 1ML LUER SLIP (SYRINGE) ×1 IMPLANT
TOWEL OR 17X24 6PK STRL BLUE (TOWEL DISPOSABLE) ×2 IMPLANT
TOWEL OR 17X26 10 PK STRL BLUE (TOWEL DISPOSABLE) ×4 IMPLANT
TRAY FOLEY CATH 16FRSI W/METER (SET/KITS/TRAYS/PACK) ×2 IMPLANT
WATER STERILE IRR 1000ML POUR (IV SOLUTION) ×4 IMPLANT

## 2012-08-22 NOTE — Op Note (Signed)
Kelli, Rodriguez NO.:  [id number]  MEDICAL RECORD NO.:  [id number]  LOCATION:  5N15C                        FACILITY:  [facility]  PHYSICIAN:  Burnard Bunting, M.D.    DATE OF BIRTH:  04/15/1934  DATE OF PROCEDURE: DATE OF DISCHARGE:                              OPERATIVE REPORT   PREOPERATIVE DIAGNOSIS:  Left knee arthritis.  POSTOPERATIVE DIAGNOSIS:  Left knee arthritis.  PROCEDURE:  Left total knee arthroplasty using Stryker components 4 tibia, 4 femur, 11 polyethylene insert, 29 mm patella.  SURGEON:  Burnard Bunting, M.D.  ASSISTANT:  None.  ANESTHESIA:  General endotracheal.  ESTIMATED BLOOD LOSS:  100 mL.  DRAINS:  None.  INDICATIONS:  Kelli Rodriguez is a patient with left knee arthritis presents for operative management after explanation of risks and benefits.  PROCEDURE IN DETAIL:  The patient was brought into the operating room where general endotracheal anesthesia was induced.  Preoperative antibiotics were administered.  Time-out was called.  Left leg was prescrubbed with alcohol, Betadine, after sterile prep, prepped with DuraPrep solution and draped in a sterile manner.  Leg was elevated and exsanguinated with an Esmarch wrap.  Tourniquet was inflated.  Incision made on the anterior aspect of the knee.  Skin and subcutaneous tissue sharply divided.  Median parapatellar approach was made to the knee, precise location marked with #1 Vicryl suture.  Soft tissue removed from the anterior distal femur.  The lateral patellofemoral ligament released.  Fat pad partially excised.  Very small periosteal sleeve elevated off the medial aspect of the tibia.  The osteophytes removed. ACL and PCL released.  Intramedullary Ranae Plumber use on the tibia and 9 mm resection initially was made off the least affected lateral side.  This was augmented with an additional 2 mm resection off the medial side due to insufficient bone resection off the medial side.  At  this time following a tibial resection with the collaterals and posterior neurovascular structures were protected.  The distal femoral cut was made.  This was made with the intramedullary alignment rod again in slight flexion.  The chamfer box cuts were then made.  After the distal femoral cut was made, the neck came out to full extension and hyperextension with 9 mm spacer.  The tibia was keel punched.  Trial reduction was performed with a 4 femur and 4 tibia.  There was laxity in varus and valgus stress at 30 degrees with a 9-mm spacer.  An 11-mm spacer gave full extension, full flexion, and good stability of varus and valgus stress at 0, 30, and 90 degrees.  Patella was then cut from 23-13 and 29 3 peg patella was placed.  At this time with trial components in position, the patient had excellent patellar tracking with no thumbs technique.  Excellent alignment in full extension.  At this time, trial components were removed.  Thorough irrigation with 3 L of irrigating solution performed.  True components placed with same stability and range of motion parameters maintained.  Some excess cement removed.  Tourniquet was released.  Bleeding points were controlled with electrocautery.  Again thorough irrigation was performed.  Exparel was used prior to implantation of the implant.  The incision was then closed using inverted #1 Vicryl suture, 0 Vicryl suture, 2-0 Vicryl suture, and skin staples.  Bulky dressing and knee immobilizer were placed.  The saphenous nerve block performed at the conclusion of the case.  The patient tolerated the procedure well without immediate complication.     Burnard Bunting, M.D.     GSD/MEDQ  D:  08/22/2012  T:  08/22/2012  Job:  [id number]

## 2012-08-22 NOTE — Progress Notes (Signed)
ANTICOAGULATION CONSULT NOTE - Initial Consult  Pharmacy Consult for Coumadin Indication: Left TKR  No Known Allergies  Patient Measurements:   Weight: 79.6 kg  Vital Signs: Temp: 97.9 F (36.6 C) (07/22 1233) Temp src: Oral (07/22 0553) BP: 155/76 mmHg (07/22 1233) Pulse Rate: 71 (07/22 1233)  Labs: No results found for this basename: HGB, HCT, PLT, APTT, LABPROT, INR, HEPARINUNFRC, CREATININE, CKTOTAL, CKMB, TROPONINI,  in the last 72 hours  The CrCl is unknown because both a height and weight (above a minimum accepted value) are required for this calculation.   Medical History: Past Medical History  Diagnosis Date  . Bronchitis, chronic   . Arthritis     knees and hands  . Anxiety   . Hypertension   . COPD (chronic obstructive pulmonary disease)     uses o2 @ night  . Ascending aortic aneurysm     followed by Dr. Laneta Simmers, 4.3 cm 08/2011 by MRA  . Aneurysm, thoracic   . Constipation due to pain medication   . Cancer     right breast cancer s/p mastectomy '00  . Anemia     Medications:  Prescriptions prior to admission  Medication Sig Dispense Refill  . acetaminophen-codeine (TYLENOL #3) 300-30 MG per tablet Take 1 tablet by mouth every 6 (six) hours as needed for pain.      Marland Kitchen CALCIUM PO Take 1 tablet by mouth daily.      . Cyanocobalamin (VITAMIN B-12 PO) Take 1 tablet by mouth daily.       . hydrochlorothiazide (HYDRODIURIL) 25 MG tablet Take 25 mg by mouth daily with breakfast.       . LORazepam (ATIVAN) 0.5 MG tablet Take 0.5 mg by mouth at bedtime as needed (sleep).       . losartan (COZAAR) 100 MG tablet Take 100 mg by mouth daily with breakfast.       . metoprolol succinate (TOPROL-XL) 25 MG 24 hr tablet Take 25 mg by mouth every evening. 5 pm      . Multiple Vitamin (MULTIVITAMIN WITH MINERALS) TABS Take 1 tablet by mouth daily.      . rosuvastatin (CRESTOR) 10 MG tablet Take 10 mg by mouth every other day.      . traMADol (ULTRAM) 50 MG tablet Take 50 mg  by mouth daily as needed for pain.        Assessment: 78 yo F, hx of left knee pain with failed non-surgical treatments.  Patient had a left TKR on 7/22.  Started on coumadin for VTE ppx.  Patient is currently stable, with elevated BP, no acute infection.  Baseline coag from 7/11 was wnl.  Goal of Therapy:  INR 2-3 Monitor platelets by anticoagulation protocol: Yes   Plan:  1. Start Coumadin 5 mg x1 2. Daily INR, order patient coumadin video/book 3. F/u cbc, bmp, and urine cx results  Anabel Bene, PharmD Clinical Pharmacist Pager: [phone]  Anabel Bene 08/22/2012,2:20 PM

## 2012-08-22 NOTE — Preoperative (Signed)
Beta Blockers   Reason not to administer Beta Blockers:Not Applicable 

## 2012-08-22 NOTE — Anesthesia Postprocedure Evaluation (Signed)
  Anesthesia Post-op Note  Patient: Kelli Rodriguez  Procedure(s) Performed: Procedure(s): TOTAL KNEE ARTHROPLASTY- left (Left)  Patient Location: PACU  Anesthesia Type:General  Level of Consciousness: awake, alert  and oriented  Airway and Oxygen Therapy: Patient Spontanous Breathing and Patient connected to nasal cannula oxygen  Post-op Pain: mild  Post-op Assessment: Post-op Vital signs reviewed, Patient's Cardiovascular Status Stable, Respiratory Function Stable, Patent Airway and Pain level controlled  Post-op Vital Signs: stable  Complications: No apparent anesthesia complications

## 2012-08-22 NOTE — Addendum Note (Signed)
Addendum created 08/22/12 1341 by Kipp Brood, MD   Modules edited: Anesthesia Blocks and Procedures

## 2012-08-22 NOTE — Evaluation (Signed)
Physical Therapy Evaluation Patient Details Name: Kelli Rodriguez MRN: [id number] DOB: Apr 09, 1934 Today's Date: 08/22/2012 Time: 5784-6962 PT Time Calculation (min): 45 min  PT Assessment / Plan / Recommendation History of Present Illness  78 year old s/p Left TKR  Clinical Impression  Pt having difficulty due to lethargy and lightheadedness (BP = 187/76), pt feels it is medication related. She was able to stand with min assist and RW but unable to transfer to recliner safely today due to "wooziness". Pt motivated to get out of bed tomorrow. Pt does not have appropriate support at D/C and will need SNF for further rehabilitation.    PT Assessment  Patient needs continued PT services    Follow Up Recommendations  SNF       Barriers to Discharge   Pt does not have any assist at home, will need SNF for rehabilitation    Equipment Recommendations  None recommended by PT       Frequency 7X/week    Precautions / Restrictions Precautions Precautions: Knee Required Braces or Orthoses: Knee Immobilizer - Left Knee Immobilizer - Left: On except when in CPM Restrictions Weight Bearing Restrictions: Yes LLE Weight Bearing: Weight bearing as tolerated   Pertinent Vitals/Pain SpO2 down to 85% on room air, back up to 95% with 2L [place] O2. Pain 6/10 in Lt. Knee mostly with movements.       Mobility  Bed Mobility Bed Mobility: Rolling Left;Left Sidelying to Sit Rolling Left: 4: Min assist Left Sidelying to Sit: 4: Min assist Details for Bed Mobility Assistance: Assist primarily for Lt. LE. Cues for sequencing, efficiency, and pain management. Transfers Transfers: Sit to Stand;Stand to [employer] Transfers Sit to Stand: 4: Min assist Stand to Sit: 4: Min assist Details for Transfer Assistance: Pt uanble to safely attempt stand pivot due to light headedness and feeling like she was going to pass out (BP 187/76). Pt declined second attempt "It won't be like this tomorrow I just can't  do it today" Ambulation/Gait Ambulation/Gait Assistance: Not tested (comment) (unable today)    Exercises Total Joint Exercises Ankle Circles/Pumps: AROM;Both;20 reps;Supine Quad Sets: AROM;Left;10 reps Short Arc Quad: AROM;Left;10 reps Heel Slides: AAROM;Left;10 reps   PT Diagnosis: Difficulty walking;Generalized weakness;Acute pain  PT Problem List: Decreased strength;Decreased range of motion;Decreased activity tolerance;Decreased balance;Decreased mobility;Decreased knowledge of precautions;Decreased knowledge of use of DME;Cardiopulmonary status limiting activity;Pain PT Treatment Interventions: DME instruction;Stair training;Gait training;Functional mobility training;Therapeutic activities;Patient/family education;Neuromuscular re-education;Balance training;Therapeutic exercise     PT Goals(Current goals can be found in the care plan section) Acute Rehab PT Goals Patient Stated Goal: Get home after rehabilitation PT Goal Formulation: With patient Time For Goal Achievement: 08/29/12 Potential to Achieve Goals: Good  Visit Information  Last PT Received On: 08/22/12 Assistance Needed: +1 History of Present Illness: 78 year old s/p Left TKR       Prior Functioning  Home Living Family/patient expects to be discharged to:: Private residence Living Arrangements: Alone Available Help at Discharge: Skilled Nursing Facility Type of Home: Skilled Nursing Facility Additional Comments: pt plans to D/C to SNF for rehab prior to returning home.   Prior Function Level of Independence: Independent Comments: needed cane for past few weeks due to Lt. knee pain Communication Communication: No difficulties Dominant Hand: Right    Cognition  Cognition Arousal/Alertness: Lethargic Behavior During Therapy: WFL for tasks assessed/performed Overall Cognitive Status: Difficult to assess Difficult to assess due to: Level of arousal    Extremity/Trunk Assessment Upper Extremity  Assessment Upper Extremity  Assessment: Defer to OT evaluation Lower Extremity Assessment Lower Extremity Assessment: RLE deficits/detail;LLE deficits/detail RLE Deficits / Details: Weakness particularly noted with functional mobility grossly >3/5 LLE Deficits / Details: Deficits from recent surgery, not tested due to pain. Unable to get a good quad set, barely trace firing LLE: Unable to fully assess due to pain Cervical / Trunk Assessment Cervical / Trunk Assessment: Normal   Balance Balance Balance Assessed: Yes Static Sitting Balance Static Sitting - Balance Support: No upper extremity supported;Feet supported Static Sitting - Level of Assistance: 5: Stand by assistance Static Standing Balance Static Standing - Balance Support: Bilateral upper extremity supported Static Standing - Level of Assistance: 4: Min assist Static Standing - Comment/# of Minutes: min-guard assist due to increased sway secondary to pt c/o lightheadedness.   End of Session PT - End of Session Equipment Utilized During Treatment: Gait belt Activity Tolerance: Patient limited by lethargy Patient left: in bed;with call bell/phone within reach;with bed alarm set Nurse Communication: Mobility status (request for CPM after pt recovered, elevated BP) CPM Left Knee CPM Left Knee: Off Left Knee Flexion (Degrees): 40 Left Knee Extension (Degrees): 0 Additional Comments: patient completed 2 hours  GP     Wilhemina Bonito 08/22/2012, 4:34 PM

## 2012-08-22 NOTE — Plan of Care (Signed)
Problem: Consults Goal: Diagnosis- Total Joint Replacement Primary Total Knee Left     

## 2012-08-22 NOTE — Brief Op Note (Signed)
08/22/2012  10:15 AM  PATIENT:  Moshe Salisbury  78 y.o. female  PRE-OPERATIVE DIAGNOSIS:  Left Knee Osteoarthritis  POST-OPERATIVE DIAGNOSIS:  Left Knee Osteoarthritis  PROCEDURE:  Procedure(s): TOTAL KNEE ARTHROPLASTY- left  SURGEON:  Surgeon(s): Cammy Copa, MD  ASSISTANT: gil clark pa  ANESTHESIA:   general  EBL: 100 ml    Total I/O In: 1000 [I.V.:1000] Out: 375 [Urine:300; Blood:75]  BLOOD ADMINISTERED: none  DRAINS: none   LOCAL MEDICATIONS USED:  none  SPECIMEN:  No Specimen  COUNTS:  YES  TOURNIQUET:  * Missing tourniquet times found for documented tourniquets in log:  [id number] *  DICTATION: .Other Dictation: Dictation Number [id number]  PLAN OF CARE: Admit to inpatient   PATIENT DISPOSITION:  PACU - hemodynamically stable

## 2012-08-22 NOTE — Progress Notes (Signed)
Orthopedic Tech Progress Note Patient Details:  Kelli Rodriguez 1934/05/24 [id number]  CPM Left Knee CPM Left Knee: On Left Knee Flexion (Degrees): 50 Left Knee Extension (Degrees): 0 Additional Comments: trapeze bar   Shawnie Pons 08/22/2012, 11:09 AM

## 2012-08-22 NOTE — H&P (Addendum)
TOTAL KNEE ADMISSION H&P  Patient is being admitted for left total knee arthroplasty.  Subjective:  Chief Complaint:left knee pain.  HPI: Kelli Rodriguez, 78 y.o. female, has a history of pain and functional disability in the left knee due to arthritis and has failed non-surgical conservative treatments for greater than 12 weeks to includeNSAID's and/or analgesics, corticosteriod injections, viscosupplementation injections, use of assistive devices and activity modification.  Onset of symptoms was gradual, starting >10 years ago with gradually worsening course since that time. The patient noted no past surgery on the left knee(s).  Patient currently rates pain in the left knee(s) at 8 out of 10 with activity. Patient has night pain, worsening of pain with activity and weight bearing, pain that interferes with activities of daily living, pain with passive range of motion, crepitus and joint swelling.  Patient has evidence of subchondral sclerosis, periarticular osteophytes and joint space narrowing by imaging studies. This patient has had a good result with her right TKA. There is no active infection.  Patient Active Problem List   Diagnosis Date Noted  . Ascending aortic aneurysm 08/24/2011  . BREAST CANCER 07/09/2009  . HYPERLIPIDEMIA 07/09/2009  . BRONCHIECTASIS 07/09/2009  . HEMOPTYSIS UNSPECIFIED 07/09/2009  . HYPERTENSION 07/08/2009   Past Medical History  Diagnosis Date  . Bronchitis, chronic   . Arthritis     knees and hands  . Anxiety   . Hypertension   . COPD (chronic obstructive pulmonary disease)     uses o2 @ night  . Ascending aortic aneurysm     followed by Dr. Laneta Simmers, 4.3 cm 08/2011 by [facility]  . Aneurysm, thoracic   . Constipation due to pain medication   . Cancer     right breast cancer s/p mastectomy '00  . Anemia     Past Surgical History  Procedure Laterality Date  . Tubal ligation      1968  . Knee arthroscopy      right  . Mastectomy      right    2002   . Right breast implant    . Abdominal hysterectomy    . Bladder surgery      tacked  . Breast surgery    . Appendectomy    . Total knee arthroplasty  02/01/2012    RIGHT KNEE  . Total knee arthroplasty  02/01/2012    Procedure: TOTAL KNEE ARTHROPLASTY;  Surgeon: Cammy Copa, MD;  Location: [facility] OR;  Service: Orthopedics;  Laterality: Right;  Right total knee arthroplasty  . Eye surgery      bilateral--lens implant left    Prescriptions prior to admission  Medication Sig Dispense Refill  . acetaminophen-codeine (TYLENOL #3) 300-30 MG per tablet Take 1 tablet by mouth every 6 (six) hours as needed for pain.      Marland Kitchen CALCIUM PO Take 1 tablet by mouth daily.      . Cyanocobalamin (VITAMIN B-12 PO) Take 1 tablet by mouth daily.       . hydrochlorothiazide (HYDRODIURIL) 25 MG tablet Take 25 mg by mouth daily with breakfast.       . LORazepam (ATIVAN) 0.5 MG tablet Take 0.5 mg by mouth at bedtime as needed (sleep).       . losartan (COZAAR) 100 MG tablet Take 100 mg by mouth daily with breakfast.       . metoprolol succinate (TOPROL-XL) 25 MG 24 hr tablet Take 25 mg by mouth every evening. 5 pm      .  Multiple Vitamin (MULTIVITAMIN WITH MINERALS) TABS Take 1 tablet by mouth daily.      . rosuvastatin (CRESTOR) 10 MG tablet Take 10 mg by mouth every other day.      . traMADol (ULTRAM) 50 MG tablet Take 50 mg by mouth daily as needed for pain.       No Known Allergies  History  Substance Use Topics  . Smoking status: Never Smoker   . Smokeless tobacco: Never Used  . Alcohol Use: No    No family history on file.   Review of Systems  Constitutional: Negative.   HENT: Negative.   Eyes: Negative.   Respiratory: Negative.   Cardiovascular: Negative.   Gastrointestinal: Negative.   Genitourinary: Negative.   Musculoskeletal: Positive for joint pain.  Skin: Negative.   Neurological: Negative.   Endo/Heme/Allergies: Negative.   Psychiatric/Behavioral: Negative.      Objective:  Physical Exam  Constitutional: She appears well-developed.  HENT:  Head: Normocephalic.  Eyes: Pupils are equal, round, and reactive to light.  Neck: Normal range of motion.  Cardiovascular: Normal rate.   Respiratory: Effort normal.  GI: Soft.  Neurological: She is alert.  Skin: Skin is warm.  Psychiatric: She has a normal mood and affect.  left knee has medial joint line tenderness stable collaterals - dp 2/4 - rom 0 - 115 - effusion - skin ok  Vital signs in last 24 hours: Temp:  [96.8 F (36 C)] 96.8 F (36 C) (07/22 0553) Pulse Rate:  [82] 82 (07/22 0553) Resp:  [20] 20 (07/22 0553) BP: (181)/(64) 181/64 mmHg (07/22 0553) SpO2:  [100 %] 100 % (07/22 0553)  Labs:   Estimated body mass index is 30.26 kg/(m^2) as calculated from the following:   Height as of 08/11/12: 5\' 4"  (1.626 m).   Weight as of 01/24/12: 80 kg (176 lb 5.9 oz).   Imaging Review Plain radiographs demonstrate significant degenerative joint disease of the left knee(s). The overall alignment ismild varus. The bone quality appears to be good for age and reported activity level.  Assessment/Plan:  End stage arthritis, left knee   The patient history, physical examination, clinical judgment of the provider and imaging studies are consistent with end stage degenerative joint disease of the left knee(s) and total knee arthroplasty is deemed medically necessary. The treatment options including medical management, injection therapy arthroscopy and arthroplasty were discussed at length. The risks and benefits of total knee arthroplasty were presented and reviewed. The risks due to aseptic loosening, infection, stiffness, patella tracking problems, thromboembolic complications and other imponderables were discussed. The patient acknowledged the explanation, agreed to proceed with the plan and consent was signed. Patient is being admitted for inpatient treatment for surgery, pain control, PT, OT,  prophylactic antibiotics, VTE prophylaxis, progressive ambulation and ADL's and discharge planning. The patient is planning to be discharged home with home health services

## 2012-08-22 NOTE — Progress Notes (Signed)
Rec'd report from Lorriane Shire RN, assuming care of patient

## 2012-08-22 NOTE — Anesthesia Procedure Notes (Addendum)
Procedure Name: Intubation Date/Time: 08/22/2012 7:36 AM Performed by: Margaree Mackintosh Pre-anesthesia Checklist: Patient identified, Timeout performed, Emergency Drugs available, Suction available and Patient being monitored Patient Re-evaluated:Patient Re-evaluated prior to inductionOxygen Delivery Method: Circle system utilized Preoxygenation: Pre-oxygenation with 100% oxygen Intubation Type: IV induction Ventilation: Mask ventilation without difficulty and Oral airway inserted - appropriate to patient size Laryngoscope Size: Hyacinth Meeker and 2 Grade View: Grade I Tube type: Oral Tube size: 7.5 mm Number of attempts: 1 Airway Equipment and Method: Stylet and LTA kit utilized Placement Confirmation: ETT inserted through vocal cords under direct vision,  positive ETCO2 and breath sounds checked- equal and bilateral Secured at: 21 cm Tube secured with: Tape Dental Injury: Teeth and Oropharynx as per pre-operative assessment     Anesthesia Regional Block:  Femoral nerve block  Pre-Anesthetic Checklist: ,, timeout performed, Correct Patient, Correct Site, Correct Laterality, Correct Procedure, Correct Position, site marked, Risks and benefits discussed,  Surgical consent,  Pre-op evaluation,  At surgeon's request and post-op pain management  Laterality: Left  Prep: chloraprep       Needles:  Injection technique: Single-shot  Needle Type: Echogenic Stimulator Needle     Needle Length:cm 9 cm Needle Gauge: 22 and 22 G    Additional Needles:  Procedures: ultrasound guided (picture in chart) Femoral nerve block Narrative:  Start time: 08/22/2012 10:20 AM End time: 08/22/2012 10:25 AM Injection made incrementally with aspirations every 5 mL.  Performed by: Personally   Additional Notes: L. Adductor canal block 25 cc 0.5% marcaine with 1:200 Epi injected easily  Kipp Brood, MD

## 2012-08-22 NOTE — Progress Notes (Signed)
UR COMPLETED  

## 2012-08-22 NOTE — Anesthesia Preprocedure Evaluation (Signed)
Anesthesia Evaluation  Patient identified by MRN, date of birth, ID band Patient awake    Reviewed: Allergy & Precautions, H&P , NPO status   Airway Mallampati: II      Dental  (+) Edentulous Upper, Edentulous Lower and Dental Advisory Given   Pulmonary  breath sounds clear to auscultation        Cardiovascular Rhythm:Regular Rate:Normal     Neuro/Psych    GI/Hepatic   Endo/Other    Renal/GU      Musculoskeletal   Abdominal   Peds  Hematology   Anesthesia Other Findings   Reproductive/Obstetrics                           Anesthesia Physical Anesthesia Plan  ASA: III  Anesthesia Plan: General   Post-op Pain Management:    Induction:   Airway Management Planned: Oral ETT  Additional Equipment:   Intra-op Plan:   Post-operative Plan: Extubation in OR  Informed Consent: I have reviewed the patients History and Physical, chart, labs and discussed the procedure including the risks, benefits and alternatives for the proposed anesthesia with the patient or authorized representative who has indicated his/her understanding and acceptance.     Plan Discussed with: CRNA and Surgeon  Anesthesia Plan Comments:         Anesthesia Quick Evaluation

## 2012-08-22 NOTE — Transfer of Care (Signed)
Immediate Anesthesia Transfer of Care Note  Patient: Kelli Rodriguez  Procedure(s) Performed: Procedure(s): TOTAL KNEE ARTHROPLASTY- left (Left)  Patient Location: PACU  Anesthesia Type:General  Level of Consciousness: awake, alert  and oriented  Airway & Oxygen Therapy: Patient Spontanous Breathing and Patient connected to nasal cannula oxygen  Post-op Assessment: Report given to PACU RN and Post -op Vital signs reviewed and stable  Post vital signs: Reviewed and stable  Complications: No apparent anesthesia complications

## 2012-08-23 ENCOUNTER — Encounter (HOSPITAL_COMMUNITY): Payer: Self-pay | Admitting: Orthopedic Surgery

## 2012-08-23 LAB — CBC
HCT: 26.6 % — ABNORMAL LOW (ref 36.0–46.0)
Hemoglobin: 9.2 g/dL — ABNORMAL LOW (ref 12.0–15.0)
MCV: 89.3 fL (ref 78.0–100.0)
RBC: 2.98 MIL/uL — ABNORMAL LOW (ref 3.87–5.11)
RDW: 11.8 % (ref 11.5–15.5)
WBC: 12 10*3/uL — ABNORMAL HIGH (ref 4.0–10.5)

## 2012-08-23 LAB — BASIC METABOLIC PANEL
BUN: 11 mg/dL (ref 6–23)
CO2: 26 mEq/L (ref 19–32)
Chloride: 96 mEq/L (ref 96–112)
Creatinine, Ser: 0.64 mg/dL (ref 0.50–1.10)
GFR calc Af Amer: >90 mL/min (ref >90)
Potassium: 3.6 mEq/L (ref 3.5–5.1)

## 2012-08-23 LAB — PROTIME-INR: INR: 1.15 (ref 0.00–1.49)

## 2012-08-23 MED ORDER — WARFARIN SODIUM 5 MG PO TABS
5.0000 mg | ORAL_TABLET | Freq: Once | ORAL | Status: AC
  Administered 2012-08-23: 5 mg via ORAL
  Filled 2012-08-23: qty 1

## 2012-08-23 NOTE — Clinical Documentation Improvement (Signed)
THIS DOCUMENT IS NOT A PERMANENT PART OF THE MEDICAL RECORD  Please update your documentation within the medical record to reflect your response to this query. If you need help knowing how to do this please call [phone].  08/23/12  To Graylin Shiver. August Saucer, MD Marton Redwood  In a better effort to capture your patient's severity of illness, reflect appropriate length of stay and utilization of resources, a review of the medical record has revealed the following indicators.    Based on your clinical judgment, please clarify and document in a progress note and/or discharge summary the clinical condition associated with the following supporting information:  In responding to this query please exercise your independent judgment.  The fact that a query is asked, does not imply that any particular answer is desired or expected.  Abnormal findings (laboratory, x-ray, pathologic, and other diagnostic results) are not coded and reported unless the physician indicates their clinical significance.   The medical record reflects the following clinical findings, please clarify the diagnostic and/or clinical significance:      Patient's urine culture grew >=100,000 COLONIES/ML ESCHERICHIA COLI, bacteria, if appropriate please document diagnosis regarding lab data.  Thank you    Possible Clinical Conditions?              Bacteruria  Ecoli UTI                      Other Condition              Cannot Clinically Determine    Evaluated : U/A , Urine cx  Reviewed: Pre op bacturia prog nt 08/24/2012 11:33 PM per dr Rise Paganini      Thank You,  Leonette Most Fritzi Scripter  Clinical Documentation Specialist: [phone] Health Information Management [place]

## 2012-08-23 NOTE — Progress Notes (Signed)
PT Cancellation Note  Patient Details Name: Kelli Rodriguez MRN: [id number] DOB: 1934/09/04   Cancelled Treatment:     Attempted to see pt this am, she was in too much pain and requested I return later.  Attempted in pm, pt in cpm and declined therapy.  Reported she was up in room and bathroom and feels weak overall.  Provided words of encouragement and plan to return next date.   Narda Amber [facility] 08/23/2012, 4:39 PM

## 2012-08-23 NOTE — Progress Notes (Signed)
Subjective: Pt stable - pain controlled   Objective: Vital signs in last 24 hours: Temp:  [97.5 F (36.4 C)-100.3 F (37.9 C)] 99.4 F (37.4 C) (07/23 0553) Pulse Rate:  [64-120] 95 (07/23 0553) Resp:  [13-23] 18 (07/23 0553) BP: (153-187)/(60-95) 153/88 mmHg (07/23 0553) SpO2:  [85 %-100 %] 99 % (07/23 0553)  Intake/Output from previous day: 07/22 0701 - 07/23 0700 In: 2827.5 [P.O.:230; I.V.:2547.5; IV Piggyback:50] Out: 2675 [Urine:2600; Blood:75] Intake/Output this shift: Total I/O In: 597.5 [I.V.:547.5; IV Piggyback:50] Out: 2300 [Urine:2300]  Exam:  Neurovascular intact Sensation intact distally Intact pulses distally  Labs:  Recent Labs  08/23/12 0425  HGB 9.2*    Recent Labs  08/23/12 0425  WBC 12.0*  RBC 2.98*  HCT 26.6*  PLT 241    Recent Labs  08/23/12 0425  NA 132*  K 3.6  CL 96  CO2 26  BUN 11  CREATININE 0.64  GLUCOSE 138*  CALCIUM 8.5    Recent Labs  08/23/12 0425  INR 1.15    Assessment/Plan: Plan CPM today- PT - dc pca   Anida Deol SCOTT 08/23/2012, 7:00 AM

## 2012-08-23 NOTE — Progress Notes (Signed)
ANTICOAGULATION CONSULT NOTE - Follow Up Consult  Pharmacy Consult for Coumadin Indication: Left TKR  No Known Allergies  Patient Measurements:   Weight: 79.6 kg  Vital Signs: Temp: 99.4 F (37.4 C) (07/23 0553) BP: 153/88 mmHg (07/23 0553) Pulse Rate: 95 (07/23 0553)  Labs:  Recent Labs  08/23/12 0425  HGB 9.2*  HCT 26.6*  PLT 241  LABPROT 14.5  INR 1.15  CREATININE 0.64    The CrCl is unknown because both a height and weight (above a minimum accepted value) are required for this calculation.   Medications:  Scheduled:  . atorvastatin  10 mg Oral q1800  . docusate sodium  100 mg Oral BID  . hydrochlorothiazide  25 mg Oral Q breakfast  . losartan  100 mg Oral Q breakfast  . metoprolol succinate  25 mg Oral QPM  . patient's guide to using coumadin book   Does not apply Once  . warfarin   Does not apply Once  . Warfarin - Pharmacist Dosing Inpatient   Does not apply q1800    Assessment: 78 yo F, hx of left knee pain with failed non-surgical treatments. Patient had a left TKR on 7/22. Started on coumadin for VTE ppx.  No signs of bleeding from notes.  Patient is currently stable, with elevated BP but will continue to monitor. Baseline coag from 7/11 was wnl, INR today is subtherapeutic.  Goal of Therapy:  INR 2-3 Monitor platelets by anticoagulation protocol: Yes   Plan:  1. Conitnue Coumadin 5 mg x1 2. Daily INR 3. F/u cbc, plt, cx results 4. Warfarin education  Anabel Bene, PharmD Clinical Pharmacist Resident Pager: [phone]  Anabel Bene 08/23/2012,11:18 AM

## 2012-08-23 NOTE — Progress Notes (Signed)
Orthopedic Tech Progress Note Patient Details:  Kelli Rodriguez [birth year] [id number] Put on cpm at 3:05 PM LLE 0-40 Patient ID: Kelli Rodriguez, female   DOB: 11/02/1934, 78 y.o.   MRN: [id number]   [name] 08/23/2012, 3:21 PM

## 2012-08-24 LAB — URINE CULTURE: Colony Count: >=100000

## 2012-08-24 LAB — CBC
Hemoglobin: 9.1 g/dL — ABNORMAL LOW (ref 12.0–15.0)
MCH: 32 pg (ref 26.0–34.0)
MCV: 87.3 fL (ref 78.0–100.0)
Platelets: 211 10*3/uL (ref 150–400)
RBC: 2.84 MIL/uL — ABNORMAL LOW (ref 3.87–5.11)
WBC: 13.7 10*3/uL — ABNORMAL HIGH (ref 4.0–10.5)

## 2012-08-24 MED ORDER — METHOCARBAMOL 500 MG PO TABS: 500.0000 mg | ORAL_TABLET | Freq: Three times a day (TID) | ORAL | Status: DC

## 2012-08-24 MED ORDER — WARFARIN SODIUM 5 MG PO TABS: 5.0000 mg | ORAL_TABLET | Freq: Every day | ORAL | Status: DC

## 2012-08-24 MED ORDER — OXYCODONE HCL 5 MG PO TABS: 5.0000 mg | ORAL_TABLET | ORAL | Status: DC | PRN

## 2012-08-24 MED ORDER — WARFARIN SODIUM 2.5 MG PO TABS
2.5000 mg | ORAL_TABLET | Freq: Once | ORAL | Status: AC
  Administered 2012-08-24: 2.5 mg via ORAL
  Filled 2012-08-24: qty 1

## 2012-08-24 MED ORDER — SODIUM CHLORIDE 0.9 % IJ SOLN
3.0000 mL | Freq: Two times a day (BID) | INTRAMUSCULAR | Status: DC
  Administered 2012-08-24 – 2012-08-25 (×3): 3 mL via INTRAVENOUS

## 2012-08-24 MED ORDER — SODIUM CHLORIDE 0.9 % IJ SOLN: 3.0000 mL | INTRAMUSCULAR | Status: DC | PRN

## 2012-08-24 NOTE — Progress Notes (Signed)
Physical Therapy Treatment Patient Details Name: Kelli Rodriguez MRN: [id number] DOB: 07/11/1934 Today's Date: 08/24/2012 Time: 4098-1191 PT Time Calculation (min): 20 min  PT Assessment / Plan / Recommendation  History of Present Illness 78 year old s/p Left TKR   Clinical Impression Pt making progress with mobility today.  Increased ambulation distance however she requires strong encouragement to increase.  Cont to recommend SNF at d/c to maximize functional recovery & safety.        Follow Up Recommendations  SNF     Does the patient have the potential to tolerate intense rehabilitation     Barriers to Discharge        Equipment Recommendations  None recommended by PT    Recommendations for Other Services    Frequency 7X/week   Progress towards PT Goals Progress towards PT goals: Progressing toward goals  Plan Current plan remains appropriate    Precautions / Restrictions Precautions Precautions: Knee Restrictions LLE Weight Bearing: Weight bearing as tolerated       Mobility  Bed Mobility Bed Mobility: Not assessed Transfers Transfers: Sit to Stand;Stand to Sit Sit to Stand: 4: Min assist;With upper extremity assist;With armrests;From bed;From chair/3-in-1 Stand to Sit: 4: Min assist;With upper extremity assist;With armrests;To chair/3-in-1 Details for Transfer Assistance: cues for hand placement & technique.  Ambulation/Gait Ambulation/Gait Assistance: 4: Min guard Ambulation Distance (Feet): 30 Feet Assistive device: Rolling walker Ambulation/Gait Assistance Details: Cues for sequencing, lateral weight shifting to allow RLE advancement, posture, & use of UE's of offload weight from LLE during stance phase for pain control.  Pt also requires max encouragement to increase distance.   Gait Pattern: Step-to pattern;Decreased stance time - left;Decreased step length - right;Decreased weight shift to left;Antalgic Stairs: No Wheelchair Mobility Wheelchair Mobility:  No    Exercises     PT Diagnosis:    PT Problem List:   PT Treatment Interventions:     PT Goals (current goals can now be found in the care plan section) Acute Rehab PT Goals Patient Stated Goal: Get home after rehabilitation PT Goal Formulation: With patient Time For Goal Achievement: 08/29/12 Potential to Achieve Goals: Good  Visit Information  Last PT Received On: 08/24/12 Assistance Needed: +1 History of Present Illness: 78 year old s/p Left TKR    Subjective Data  Patient Stated Goal: Get home after rehabilitation   Cognition  Cognition Arousal/Alertness: Awake/alert Behavior During Therapy: WFL for tasks assessed/performed Overall Cognitive Status: Within Functional Limits for tasks assessed    Balance     End of Session PT - End of Session Equipment Utilized During Treatment: Gait belt;Left knee immobilizer Activity Tolerance: Patient tolerated treatment well Patient left: in chair;with call bell/phone within reach Nurse Communication: Mobility status   GP     Lara Mulch 08/24/2012, 2:38 PM    Verdell Face, PTA [phone] 08/24/2012

## 2012-08-24 NOTE — Discharge Summary (Signed)
Physician Discharge Summary  Patient ID: Kelli Rodriguez MRN: [id number] DOB/AGE: 1934/07/06 78 y.o.  Admit date: 08/22/2012 Discharge date: 08/25/2012  Admission Diagnoses:  lEft knee arthritis  Discharge Diagnoses:  Same  Surgeries: Procedure(s): TOTAL KNEE ARTHROPLASTY- left on 08/22/2012   Consultants:    Discharged Condition: Stable  Hospital Course: Kelli Rodriguez is an 78 y.o. female who was admitted 08/22/2012 with a chief complaint of left knee pain, and found to have a diagnosis of left knee arthritis.  They were brought to the operating room on 08/22/2012 and underwent the above named procedures. She tolerated PT well and was ambulating in the hall by time of discharge.    Antibiotics given:  Anti-infectives   Start     Dose/Rate Route Frequency Ordered Stop   08/22/12 1400  ceFAZolin (ANCEF) IVPB 2 g/50 mL premix     2 g 100 mL/hr over 30 Minutes Intravenous 3 times per day 08/22/12 1233 08/22/12 2251   08/22/12 0600  ceFAZolin (ANCEF) IVPB 2 g/50 mL premix     2 g 100 mL/hr over 30 Minutes Intravenous On call to O.R. 08/21/12 1412 08/22/12 0745    .  Recent vital signs:  Filed Vitals:   08/24/12 2030  BP: 114/48  Pulse: 82  Temp: 99.1 F (37.3 C)  Resp: 16    Recent laboratory studies:  Results for orders placed during the hospital encounter of 08/22/12  URINE CULTURE      Result Value Range   Specimen Description URINE, CLEAN CATCH     Special Requests NONE     Culture  Setup Time 08/22/2012 06:12     Colony Count >=100,000 COLONIES/ML     Culture ESCHERICHIA COLI     Report Status 08/24/2012 FINAL     Organism ID, Bacteria ESCHERICHIA COLI    URINALYSIS, ROUTINE W REFLEX MICROSCOPIC      Result Value Range   Color, Urine YELLOW  YELLOW   APPearance CLOUDY (*) CLEAR   Specific Gravity, Urine 1.012  1.005 - 1.030   pH 7.0  5.0 - 8.0   Glucose, UA NEGATIVE  NEGATIVE mg/dL   Hgb urine dipstick NEGATIVE  NEGATIVE   Bilirubin Urine NEGATIVE   NEGATIVE   Ketones, ur NEGATIVE  NEGATIVE mg/dL   Protein, ur NEGATIVE  NEGATIVE mg/dL   Urobilinogen, UA 0.2  0.0 - 1.0 mg/dL   Nitrite NEGATIVE  NEGATIVE   Leukocytes, UA LARGE (*) NEGATIVE  URINE MICROSCOPIC-ADD ON      Result Value Range   Squamous Epithelial / LPF FEW (*) RARE   WBC, UA 21-50  <3 WBC/hpf   RBC / HPF 0-2  <3 RBC/hpf   Bacteria, UA FEW (*) RARE  PROTIME-INR      Result Value Range   Prothrombin Time 14.5  11.6 - 15.2 seconds   INR 1.15  0.00 - 1.49  CBC      Result Value Range   WBC 12.0 (*) 4.0 - 10.5 K/uL   RBC 2.98 (*) 3.87 - 5.11 MIL/uL   Hemoglobin 9.2 (*) 12.0 - 15.0 g/dL   HCT 40.9 (*) 81.1 - 91.4 %   MCV 89.3  78.0 - 100.0 fL   MCH 30.9  26.0 - 34.0 pg   MCHC 34.6  30.0 - 36.0 g/dL   RDW 78.2  95.6 - 21.3 %   Platelets 241  150 - 400 K/uL  BASIC METABOLIC PANEL      Result Value Range  Sodium 132 (*) 135 - 145 mEq/L   Potassium 3.6  3.5 - 5.1 mEq/L   Chloride 96  96 - 112 mEq/L   CO2 26  19 - 32 mEq/L   Glucose, Bld 138 (*) 70 - 99 mg/dL   BUN 11  6 - 23 mg/dL   Creatinine, Ser 1.61  0.50 - 1.10 mg/dL   Calcium 8.5  8.4 - 09.6 mg/dL   GFR calc non Af Amer 85 (*) >90 mL/min   GFR calc Af Amer >90  >90 mL/min  PROTIME-INR      Result Value Range   Prothrombin Time 19.9 (*) 11.6 - 15.2 seconds   INR 1.75 (*) 0.00 - 1.49  CBC      Result Value Range   WBC 13.7 (*) 4.0 - 10.5 K/uL   RBC 2.84 (*) 3.87 - 5.11 MIL/uL   Hemoglobin 9.1 (*) 12.0 - 15.0 g/dL   HCT 04.5 (*) 40.9 - 81.1 %   MCV 87.3  78.0 - 100.0 fL   MCH 32.0  26.0 - 34.0 pg   MCHC 36.7 (*) 30.0 - 36.0 g/dL   RDW 91.4  78.2 - 95.6 %   Platelets 211  150 - 400 K/uL    Discharge Medications:     Medication List    STOP taking these medications       acetaminophen-codeine 300-30 MG per tablet  Commonly known as:  TYLENOL #3     traMADol 50 MG tablet  Commonly known as:  ULTRAM      TAKE these medications       CALCIUM PO  Take 1 tablet by mouth daily.      hydrochlorothiazide 25 MG tablet  Commonly known as:  HYDRODIURIL  Take 25 mg by mouth daily with breakfast.     LORazepam 0.5 MG tablet  Commonly known as:  ATIVAN  Take 0.5 mg by mouth at bedtime as needed (sleep).     losartan 100 MG tablet  Commonly known as:  COZAAR  Take 100 mg by mouth daily with breakfast.     methocarbamol 500 MG tablet  Commonly known as:  ROBAXIN  Take 1 tablet (500 mg total) by mouth 3 (three) times daily.     metoprolol succinate 25 MG 24 hr tablet  Commonly known as:  TOPROL-XL  Take 25 mg by mouth every evening. 5 pm     multivitamin with minerals Tabs  Take 1 tablet by mouth daily.     oxyCODONE 5 MG immediate release tablet  Commonly known as:  Oxy IR/ROXICODONE  Take 1-2 tablets (5-10 mg total) by mouth every 3 (three) hours as needed.     rosuvastatin 10 MG tablet  Commonly known as:  CRESTOR  Take 10 mg by mouth every other day.     VITAMIN B-12 PO  Take 1 tablet by mouth daily.     warfarin 5 MG tablet  Commonly known as:  COUMADIN  Take 1 tablet (5 mg total) by mouth daily.        Diagnostic Studies: No results found.  Disposition: 03-Skilled Nursing Facility      Discharge Orders   Future Orders Complete By Expires     Call MD / Call 911  As directed     Comments:      If you experience chest pain or shortness of breath, CALL 911 and be transported to the hospital emergency room.  If you develope a fever above 101 F,  pus (white drainage) or increased drainage or redness at the wound, or calf pain, call your surgeon's office.    Constipation Prevention  As directed     Comments:      Drink plenty of fluids.  Prune juice may be helpful.  You may use a stool softener, such as Colace (over the counter) 100 mg twice a day.  Use MiraLax (over the counter) for constipation as needed.    Diet - low sodium heart healthy  As directed     Discharge instructions  As directed     Comments:      1. Keep incision dry 2. Weight  bearing as tolerated with crutches/walker 3. CPM and physical therapy as much as tolerated    Increase activity slowly as tolerated  As directed           Signed: Coryn Mosso SCOTT 08/24/2012, 11:15 PM

## 2012-08-24 NOTE — Progress Notes (Signed)
Orthopedic Tech Progress Note Patient Details:  Kelli Rodriguez Jun 12, 1934 [id number] Patient placed in CPM CPM Left Knee CPM Left Knee: On Left Knee Flexion (Degrees): 40 Left Knee Extension (Degrees): 0 Additional Comments: patient completed 2 hours   [place] 08/24/2012, 3:39 PM

## 2012-08-24 NOTE — Progress Notes (Signed)
Pre op bacturia - no uti sxs - nitrite neg

## 2012-08-24 NOTE — Progress Notes (Signed)
Subjective: Pt stable - pain better today   Objective: Vital signs in last 24 hours: Temp:  [98.6 F (37 C)-99.7 F (37.6 C)] 99 F (37.2 C) (07/24 0634) Pulse Rate:  [66-108] 66 (07/24 0634) Resp:  [16-17] 16 (07/24 0634) BP: (144-176)/(58-71) 144/58 mmHg (07/24 0634) SpO2:  [93 %-96 %] 94 % (07/24 0634)  Intake/Output from previous day: 07/23 0701 - 07/24 0700 In: 440 [P.O.:440] Out: 725 [Urine:725] Intake/Output this shift:    Exam:  Neurovascular intact Sensation intact distally Intact pulses distally Dorsiflexion/Plantar flexion intact  Labs:  Recent Labs  08/23/12 0425 08/24/12 0545  HGB 9.2* 9.1*    Recent Labs  08/23/12 0425 08/24/12 0545  WBC 12.0* 13.7*  RBC 2.98* 2.84*  HCT 26.6* 24.8*  PLT 241 211    Recent Labs  08/23/12 0425  NA 132*  K 3.6  CL 96  CO2 26  BUN 11  CREATININE 0.64  GLUCOSE 138*  CALCIUM 8.5    Recent Labs  08/23/12 0425 08/24/12 0545  INR 1.15 1.75*    Assessment/Plan: inr up - plan possible tx to snf am - change dressing am   Ellizabeth Dacruz SCOTT 08/24/2012, 8:22 AM

## 2012-08-24 NOTE — Progress Notes (Signed)
ANTICOAGULATION CONSULT NOTE   Pharmacy Consult for Coumadin Indication: Left TKR  No Known Allergies  Patient Measurements:   Weight: 79.6 kg  Vital Signs: Temp: 99 F (37.2 C) (07/24 0634) Temp src: Oral (07/24 0634) BP: 144/58 mmHg (07/24 0634) Pulse Rate: 66 (07/24 0634)  Labs:  Recent Labs  08/23/12 0425 08/24/12 0545  HGB 9.2* 9.1*  HCT 26.6* 24.8*  PLT 241 211  LABPROT 14.5 19.9*  INR 1.15 1.75*  CREATININE 0.64  --     The CrCl is unknown because both a height and weight (above a minimum accepted value) are required for this calculation.  Assessment: 78 yo F, hx of left knee pain with failed non-surgical treatments. Patient had a left TKR on 7/22. Started on coumadin for VTE ppx.    Patient is currently stable with no signs of bleeding noted. INR now trending up quickly 1.1>>1.75 this am, will reduce warfarin dose.  Goal of Therapy:  INR 2-3 Monitor platelets by anticoagulation protocol: Yes   Plan:  1. Conitnue Coumadin 2.5 mg x1 2. Daily INR 3. F/u cbc, plt 4. Warfarin education completed  Sheppard Coil PharmD., BCPS Clinical Pharmacist Pager [phone] 08/24/2012 1:52 PM

## 2012-08-25 LAB — CBC
HCT: 23.8 % — ABNORMAL LOW (ref 36.0–46.0)
MCHC: 36.1 g/dL — ABNORMAL HIGH (ref 30.0–36.0)
Platelets: 261 10*3/uL (ref 150–400)
RDW: 11.9 % (ref 11.5–15.5)

## 2012-08-25 LAB — PROTIME-INR
INR: 1.89 — ABNORMAL HIGH (ref 0.00–1.49)
Prothrombin Time: 21.1 seconds — ABNORMAL HIGH (ref 11.6–15.2)

## 2012-08-25 MED ORDER — WARFARIN SODIUM 2.5 MG PO TABS
2.5000 mg | ORAL_TABLET | Freq: Every day | ORAL | Status: DC
  Filled 2012-08-25: qty 1

## 2012-08-25 NOTE — Progress Notes (Signed)
ANTICOAGULATION CONSULT NOTE   Pharmacy Consult for Coumadin Indication: Left TKR  No Known Allergies  Labs:  Recent Labs  08/23/12 0425 08/24/12 0545 08/25/12 0540  HGB 9.2* 9.1* 8.6*  HCT 26.6* 24.8* 23.8*  PLT 241 211 261  LABPROT 14.5 19.9* 21.1*  INR 1.15 1.75* 1.89*  CREATININE 0.64  --   --     The CrCl is unknown because both a height and weight (above a minimum accepted value) are required for this calculation.  Assessment: 78 yo F, hx of left knee pain with failed non-surgical treatments. Patient had a left TKR on 7/22. Started on coumadin for VTE ppx.    Patient is currently stable with no signs of bleeding noted. INR now trending up quickly 1.1>>1.75>1.89.  Goal of Therapy:  INR 2-3 Monitor platelets by anticoagulation protocol: Yes   Plan:  1. Coumadin 2.5 mg po daily at 1800 pm 2. Daily INR 3. F/u cbc, plt 4. Warfarin education completed  Thank you. Okey Regal, PharmD [phone]  08/25/2012 2:15 PM

## 2012-08-25 NOTE — Plan of Care (Signed)
Problem: Phase III Progression Outcomes Goal: Discharge plan remains appropriate-arrangements made Pt plans to d/c to SNF for short tern rehab after acute care d/c

## 2012-08-25 NOTE — Evaluation (Signed)
Occupational Therapy Evaluation Patient Details Name: Kelli Rodriguez MRN: [id number] DOB: 1934/03/27 Today's Date: 08/25/2012 Time: 7829-5621 OT Time Calculation (min): 25 min  OT Assessment / Plan / Recommendation History of present illness 78 year old s/p Left TKR   Clinical Impression   Pt demos decline in function with ADLs and ADL mobility safety following L TKA. Pt would benefit from acute OT services to address impairments to increase level of function and safety    OT Assessment  Patient needs continued OT Services    Follow Up Recommendations  SNF    Barriers to Discharge Decreased caregiver support pt lives at home alone  Equipment Recommendations  None recommended by OT;Other (comment) (TBD)    Recommendations for Other Services    Frequency  Min 2X/week    Precautions / Restrictions Precautions Precautions: Knee Required Braces or Orthoses: Knee Immobilizer - Left Knee Immobilizer - Left: On except when in CPM Restrictions LLE Weight Bearing: Weight bearing as tolerated   Pertinent Vitals/Pain 7/10 L knee    ADL  Grooming: Performed;Wash/dry hands;Wash/dry face;Min guard Where Assessed - Grooming: Supported standing Upper Body Bathing: Simulated;Supervision/safety;Set up Lower Body Bathing: Simulated;Moderate assistance Upper Body Dressing: Performed;Supervision/safety;Set up Lower Body Dressing: Performed;Moderate assistance;Maximal assistance Toilet Transfer: Performed;Minimal assistance Toilet Transfer Method: Sit to stand Toilet Transfer Equipment: Raised toilet seat with arms (or 3-in-1 over toilet);Grab bars Toileting - Clothing Manipulation and Hygiene: Performed;Minimal assistance Where Assessed - Toileting Clothing Manipulation and Hygiene: Standing Transfers/Ambulation Related to ADLs: cues for correct hand placement ADL Comments: Pt familiar with A/E from previous knee surgery    OT Diagnosis: Generalized weakness;Acute pain  OT Problem  List: Decreased strength;Decreased activity tolerance;Impaired balance (sitting and/or standing);Pain OT Treatment Interventions: Self-care/ADL training;Therapeutic exercise;Patient/family education;Neuromuscular education;Balance training;Therapeutic activities;DME and/or AE instruction   OT Goals(Current goals can be found in the care plan section) Acute Rehab OT Goals Patient Stated Goal: Get home after rehabilitation OT Goal Formulation: With patient Time For Goal Achievement: 09/01/12 Potential to Achieve Goals: Good ADL Goals Pt Will Perform Grooming: with set-up;with supervision;standing Pt Will Perform Lower Body Bathing: with min assist;with mod assist Pt Will Perform Lower Body Dressing: with mod assist Pt Will Transfer to Toilet: with min guard assist;with supervision;grab bars (3 in 1 over toilet) Pt Will Perform Toileting - Clothing Manipulation and hygiene: with min guard assist;sit to/from stand  Visit Information  Last OT Received On: 08/25/12 Assistance Needed: +1 History of Present Illness: 78 year old s/p Left TKR       Prior Functioning     Home Living Family/patient expects to be discharged to:: Private residence Living Arrangements: Alone Available Help at Discharge: Skilled Nursing Facility Type of Home: Skilled Nursing Facility Additional Comments: pt plans to D/C to SNF for rehab prior to returning home.   Prior Function Level of Independence: Independent Comments: needed cane for past few weeks due to Lt. knee pain Communication Communication: No difficulties Dominant Hand: Right         Vision/Perception Vision - History Baseline Vision: Wears glasses all the time Patient Visual Report: No change from baseline Perception Perception: Within Functional Limits   Cognition  Cognition Arousal/Alertness: Awake/alert Behavior During Therapy: WFL for tasks assessed/performed Overall Cognitive Status: Within Functional Limits for tasks assessed     Extremity/Trunk Assessment Upper Extremity Assessment Upper Extremity Assessment: Overall WFL for tasks assessed;Generalized weakness     Mobility Bed Mobility Bed Mobility: Not assessed Transfers Sit to Stand: 4: Min guard;With upper extremity assist;With armrests;From  chair/3-in-1 Stand to Sit: 4: Min guard;With upper extremity assist;With armrests;To chair/3-in-1 Details for Transfer Assistance: cues for hand placement & technique.         Balance Balance Balance Assessed: Yes Dynamic Standing Balance Dynamic Standing - Balance Support: No upper extremity supported;During functional activity Dynamic Standing - Level of Assistance: 4: Min assist   End of Session OT - End of Session Equipment Utilized During Treatment: Gait belt;Rolling walker;Other (comment) (3 in 1 over toilet) Activity Tolerance: Patient tolerated treatment well Patient left: in chair;with call bell/phone within reach CPM Left Knee CPM Left Knee: Off  GO     Margaretmary Eddy [facility] 08/25/2012, 1:01 PM

## 2012-08-25 NOTE — Progress Notes (Signed)
Subjective: Pt stable - doing well   Objective: Vital signs in last 24 hours: Temp:  [98.1 F (36.7 C)-99.1 F (37.3 C)] 98.7 F (37.1 C) (07/25 0549) Pulse Rate:  [80-97] 80 (07/25 0549) Resp:  [16-19] 16 (07/25 0549) BP: (114-134)/(48-65) 124/65 mmHg (07/25 0549) SpO2:  [95 %-96 %] 95 % (07/25 0549)  Intake/Output from previous day: 07/24 0701 - 07/25 0700 In: 120 [P.O.:120] Out: -  Intake/Output this shift:    Exam:  Neurovascular intact Sensation intact distally Intact pulses distally Dorsiflexion/Plantar flexion intact  Labs:  Recent Labs  08/23/12 0425 08/24/12 0545 08/25/12 0540  HGB 9.2* 9.1* 8.6*    Recent Labs  08/24/12 0545 08/25/12 0540  WBC 13.7* 11.4*  RBC 2.84* 2.72*  HCT 24.8* 23.8*  PLT 211 261    Recent Labs  08/23/12 0425  NA 132*  K 3.6  CL 96  CO2 26  BUN 11  CREATININE 0.64  GLUCOSE 138*  CALCIUM 8.5    Recent Labs  08/24/12 0545 08/25/12 0540  INR 1.75* 1.89*    Assessment/Plan: Plan dc today or am pending bed availability and transportation - incision ok   Chaney Ingram SCOTT 08/25/2012, 7:16 AM

## 2012-08-25 NOTE — Progress Notes (Signed)
Physical Therapy Treatment Patient Details Name: Kelli Rodriguez MRN: [id number] DOB: Jan 19, 1935 Today's Date: 08/25/2012 Time: 4098-1191 PT Time Calculation (min): 27 min  PT Assessment / Plan / Recommendation  History of Present Illness 78 year old s/p Left TKR   Clinical Impression Pt making steady progress with mobility at this date.  Increased ambulation distance.        Follow Up Recommendations  SNF     Does the patient have the potential to tolerate intense rehabilitation     Barriers to Discharge        Equipment Recommendations  None recommended by PT    Recommendations for Other Services    Frequency 7X/week   Progress towards PT Goals Progress towards PT goals: Progressing toward goals  Plan Current plan remains appropriate    Precautions / Restrictions Precautions Precautions: Knee Restrictions LLE Weight Bearing: Weight bearing as tolerated       Mobility  Bed Mobility Bed Mobility: Not assessed Transfers Transfers: Sit to Stand;Stand to Sit Sit to Stand: 4: Min guard;With upper extremity assist;With armrests;From chair/3-in-1 Stand to Sit: 4: Min guard;With upper extremity assist;With armrests;To chair/3-in-1 Ambulation/Gait Ambulation/Gait Assistance: 4: Min guard Ambulation Distance (Feet): 60 Feet Assistive device: Rolling walker Ambulation/Gait Assistance Details: Cues for sequencing, tall posture, & increased stance time LLE during RLE swing phase.   Gait Pattern: Step-to pattern;Decreased step length - right;Decreased stance time - left;Decreased weight shift to left General Gait Details: Heavily reliance on RW with UE's.   Stairs: No Wheelchair Mobility Wheelchair Mobility: No    Exercises Total Joint Exercises Ankle Circles/Pumps: AROM;Both;10 reps Quad Sets: AROM;Both;10 reps Hip ABduction/ADduction: AAROM;Strengthening;Left;10 reps Straight Leg Raises: AAROM;Strengthening;Left;10 reps Long Arc Quad: AAROM;Strengthening;Left;10  reps Knee Flexion: AAROM;Left;10 reps Goniometric ROM: 68 degree flexion  (sitting in recliner)   PT Diagnosis:    PT Problem List:   PT Treatment Interventions:     PT Goals (current goals can now be found in the care plan section) Acute Rehab PT Goals Patient Stated Goal: Get home after rehabilitation PT Goal Formulation: With patient Time For Goal Achievement: 08/29/12 Potential to Achieve Goals: Good  Visit Information  Last PT Received On: 08/25/12 Assistance Needed: +1 History of Present Illness: 78 year old s/p Left TKR    Subjective Data  Patient Stated Goal: Get home after rehabilitation   Cognition  Cognition Arousal/Alertness: Awake/alert Behavior During Therapy: WFL for tasks assessed/performed Overall Cognitive Status: Within Functional Limits for tasks assessed    Balance     End of Session PT - End of Session Equipment Utilized During Treatment: Gait belt;Left knee immobilizer Activity Tolerance: Patient tolerated treatment well Patient left: in chair;with call bell/phone within reach Nurse Communication: Mobility status CPM Left Knee CPM Left Knee: Off   GP     Kelli Rodriguez 08/25/2012, 10:41 AM   Verdell Face, PTA [phone] 08/25/2012

## 2012-08-25 NOTE — Progress Notes (Deleted)
PT Cancellation Note  Patient Details Name: ANASTASIA TOMPSON MRN: [id number] DOB: 1934/05/11   Cancelled Treatment:    Reason Eval/Treat Not Completed: Medical issues which prohibited therapy  RN requesting to hold therapy at this time due to low Na levels & confusion.     Lara Mulch 08/25/2012, 10:04 AM  Verdell Face, PTA [phone] 08/25/2012

## 2012-08-26 NOTE — Care Management Note (Signed)
    Page 1 of 1   08/26/2012     6:11:42 AM   CARE MANAGEMENT NOTE 08/26/2012  Patient:  Kelli Rodriguez, Kelli Rodriguez   Account Number:  [id number]  Date Initiated:  08/22/2012  Documentation initiated by:  [facility]  Subjective/Objective Assessment:   TOTAL KNEE ARTHROPLASTY- left     Action/Plan:   PT/OT evals-recommended SNF   Anticipated DC Date:  08/25/2012   Anticipated DC Plan:  SKILLED NURSING FACILITY  In-house referral  Clinical Social Worker      DC Planning Services  CM consult      Choice offered to / List presented to:             Status of service:  In process, will continue to follow Medicare Important Message given?   (If response is "NO", the following Medicare IM given date fields will be blank) Date Medicare IM given:   Date Additional Medicare IM given:    Discharge Disposition:  SKILLED NURSING FACILITY  Per UR Regulation:  Reviewed for med. necessity/level of care/duration of stay  If discussed at Long Length of Stay Meetings, dates discussed:    Comments:

## 2012-09-25 ENCOUNTER — Ambulatory Visit: Payer: Medicare Other | Attending: Orthopedic Surgery | Admitting: Physical Therapy

## 2012-09-25 DIAGNOSIS — M25569 Pain in unspecified knee: Secondary | ICD-10-CM | POA: Insufficient documentation

## 2012-09-25 DIAGNOSIS — R5381 Other malaise: Secondary | ICD-10-CM | POA: Insufficient documentation

## 2012-09-25 DIAGNOSIS — Z96659 Presence of unspecified artificial knee joint: Secondary | ICD-10-CM | POA: Insufficient documentation

## 2012-09-25 DIAGNOSIS — M25669 Stiffness of unspecified knee, not elsewhere classified: Secondary | ICD-10-CM | POA: Insufficient documentation

## 2012-09-25 DIAGNOSIS — IMO0001 Reserved for inherently not codable concepts without codable children: Secondary | ICD-10-CM | POA: Insufficient documentation

## 2012-09-27 ENCOUNTER — Ambulatory Visit: Payer: Medicare Other | Admitting: Physical Therapy

## 2012-09-29 ENCOUNTER — Ambulatory Visit: Payer: Medicare Other

## 2012-10-03 ENCOUNTER — Ambulatory Visit: Payer: Medicare Other | Attending: Orthopedic Surgery | Admitting: *Deleted

## 2012-10-03 DIAGNOSIS — Z96659 Presence of unspecified artificial knee joint: Secondary | ICD-10-CM | POA: Insufficient documentation

## 2012-10-03 DIAGNOSIS — M25569 Pain in unspecified knee: Secondary | ICD-10-CM | POA: Insufficient documentation

## 2012-10-03 DIAGNOSIS — R5381 Other malaise: Secondary | ICD-10-CM | POA: Insufficient documentation

## 2012-10-03 DIAGNOSIS — IMO0001 Reserved for inherently not codable concepts without codable children: Secondary | ICD-10-CM | POA: Insufficient documentation

## 2012-10-03 DIAGNOSIS — M25669 Stiffness of unspecified knee, not elsewhere classified: Secondary | ICD-10-CM | POA: Insufficient documentation

## 2012-10-05 ENCOUNTER — Ambulatory Visit: Payer: Medicare Other | Admitting: *Deleted

## 2012-10-09 ENCOUNTER — Ambulatory Visit: Payer: Medicare Other | Admitting: Physical Therapy

## 2012-10-11 ENCOUNTER — Encounter: Payer: Medicare Other | Admitting: Physical Therapy

## 2012-10-13 ENCOUNTER — Ambulatory Visit: Payer: Medicare Other | Admitting: *Deleted

## 2012-10-17 ENCOUNTER — Ambulatory Visit: Payer: Medicare Other | Admitting: *Deleted

## 2012-10-19 ENCOUNTER — Ambulatory Visit: Payer: Medicare Other | Admitting: *Deleted

## 2012-10-20 ENCOUNTER — Ambulatory Visit: Payer: Medicare Other | Admitting: Physical Therapy

## 2012-10-24 ENCOUNTER — Ambulatory Visit: Payer: Medicare Other

## 2012-10-26 ENCOUNTER — Ambulatory Visit: Payer: Medicare Other

## 2012-10-27 ENCOUNTER — Ambulatory Visit: Payer: Medicare Other

## 2013-01-09 NOTE — Clinical Social Work Placement (Addendum)
Clinical Social Work Department  CLINICAL SOCIAL WORK PLACEMENT NOTE    Patient: Kelli Rodriguez Account Number: [id number] Admit date:08/22/12 Clinical Social Worker: Sabino Niemann LCSWA Date/time: 08/24/1409:30 AM  Clinical Social Work is seeking post-discharge placement for this patient at the following level of care: SKILLED NURSING (*CSW will update this form in Epic as items are completed)  08/22/2012 Patient/family provided with [facility] Department of Clinical Social Work's list of facilities offering this level of care within the geographic area requested by the patient (or if unable, by the patient's family).  07/22/2014Patient/family informed of their freedom to choose among providers that offer the needed level of care, that participate in Medicare, Medicaid or managed care program needed by the patient, have an available bed and are willing to accept the patient.  08/22/2012 Patient/family informed of [facility]' ownership interest in [facility], as well as of the fact that they are under no obligation to receive care at this facility.  PASARR submitted to EDS on 08/22/2012 PASARR number received from EDS on 08/22/2012 FL2 transmitted to all facilities in geographic area requested by pt/family on 08/22/2012 FL2 transmitted to all facilities within larger geographic area on  Patient informed that his/her managed care company has contracts with or will negotiate with certain facilities, including the following:  Patient/family informed of bed offers received:08/22/2012 Patient chooses bed at [facility] Physician recommends and patient chooses bed at  Patient to be transferred to on 08/25/12 Patient to be transferred to facility by [facility] The following physician request were entered in Epic:  Additional Comments:

## 2013-01-09 NOTE — Clinical Social Work Psychosocial (Signed)
Clinical Social Work Department  BRIEF PSYCHOSOCIAL ASSESSMENT  Patient: Kelli Rodriguez  Account Number: [id number]   Admit date: 08/22/12 Clinical Social Worker Sabino Niemann, MSW Date/Time: 08/24/12 Referred by: Physician Date Referred: 08/24/12 Referred for   SNF Placement   Other Referral:  Interview type: Patient  Other interview type: PSYCHOSOCIAL DATA  Living Status: Alone Admitted from facility:  Level of care:  Primary support name: [place] [address] Primary support relationship to patient: Son Degree of support available:  Strong and vested  CURRENT CONCERNS  Current Concerns   Post-Acute Placement   Other Concerns:  SOCIAL WORK ASSESSMENT / PLAN  CSW met with pt re: PT recommendation for SNF.   Pt lives alone  CSW explained placement process and answered questions.   Pt reports [facility]  as her preference    CSW completed FL2 and initiated SNF search.     Assessment/plan status: Information/Referral to [facility]  Other assessment/ plan:  Information/referral to community resources:  SNF     PATIENT'S/FAMILY'S RESPONSE TO PLAN OF CARE:  Pt  reports she is agreeable to [facility] SNF in order to increase strength and independence with mobility prior to returning home  Pt verbalized understanding of placement process and appreciation for CSW assist.   Sabino Niemann, MSW [phone]

## 2013-01-09 NOTE — Progress Notes (Signed)
Clinical social worker assisted with patient discharge to skilled nursing facility, [facility].  CSW addressed all family questions and concerns. CSW copied chart and added all important documents. CSW also set up patient transportation with [facility]. Clinical Social Worker will sign off for now as social work intervention is no longer needed.   Kelli Rodriguez, MSW, LCSWA [phone] 

## 2013-04-24 ENCOUNTER — Other Ambulatory Visit (HOSPITAL_COMMUNITY): Payer: Self-pay | Admitting: Family Medicine

## 2013-04-24 DIAGNOSIS — Z139 Encounter for screening, unspecified: Secondary | ICD-10-CM

## 2013-05-10 ENCOUNTER — Ambulatory Visit (HOSPITAL_COMMUNITY)
Admission: RE | Admit: 2013-05-10 | Discharge: 2013-05-10 | Disposition: A | Discharge status: Home / Self Care | Payer: Medicare HMO | Source: Ambulatory Visit | Attending: Family Medicine | Admitting: Family Medicine

## 2013-05-10 DIAGNOSIS — Z1231 Encounter for screening mammogram for malignant neoplasm of breast: Secondary | ICD-10-CM | POA: Insufficient documentation

## 2013-05-10 DIAGNOSIS — Z139 Encounter for screening, unspecified: Secondary | ICD-10-CM

## 2013-07-12 ENCOUNTER — Other Ambulatory Visit (HOSPITAL_COMMUNITY): Payer: Self-pay | Admitting: Family Medicine

## 2013-07-12 DIAGNOSIS — M810 Age-related osteoporosis without current pathological fracture: Secondary | ICD-10-CM

## 2013-07-17 ENCOUNTER — Ambulatory Visit (HOSPITAL_COMMUNITY)
Admission: RE | Admit: 2013-07-17 | Discharge: 2013-07-17 | Disposition: A | Discharge status: Home / Self Care | Payer: Medicare HMO | Source: Ambulatory Visit | Attending: Family Medicine | Admitting: Family Medicine

## 2013-07-17 ENCOUNTER — Other Ambulatory Visit (HOSPITAL_COMMUNITY): Payer: Medicare Other

## 2013-07-17 DIAGNOSIS — M810 Age-related osteoporosis without current pathological fracture: Secondary | ICD-10-CM | POA: Insufficient documentation

## 2013-07-19 ENCOUNTER — Other Ambulatory Visit: Payer: Self-pay

## 2013-07-19 DIAGNOSIS — I7101 Dissection of thoracic aorta: Secondary | ICD-10-CM

## 2013-07-19 DIAGNOSIS — I71019 Dissection of thoracic aorta, unspecified: Secondary | ICD-10-CM

## 2013-07-19 DIAGNOSIS — I712 Thoracic aortic aneurysm, without rupture, unspecified: Secondary | ICD-10-CM

## 2013-08-20 ENCOUNTER — Other Ambulatory Visit: Payer: Self-pay

## 2013-08-20 DIAGNOSIS — I712 Thoracic aortic aneurysm, without rupture, unspecified: Secondary | ICD-10-CM

## 2013-08-22 ENCOUNTER — Ambulatory Visit: Payer: Medicare Other | Admitting: Surgery

## 2013-08-22 ENCOUNTER — Other Ambulatory Visit: Payer: Medicare HMO

## 2013-09-03 ENCOUNTER — Other Ambulatory Visit: Payer: Self-pay | Admitting: *Deleted

## 2013-09-05 ENCOUNTER — Encounter: Payer: Self-pay | Admitting: Surgery

## 2013-09-05 ENCOUNTER — Ambulatory Visit (INDEPENDENT_AMBULATORY_CARE_PROVIDER_SITE_OTHER): Payer: Medicare HMO | Admitting: Surgery

## 2013-09-05 ENCOUNTER — Ambulatory Visit
Admission: RE | Admit: 2013-09-05 | Discharge: 2013-09-05 | Disposition: A | Discharge status: Home / Self Care | Payer: Medicare HMO | Source: Ambulatory Visit | Attending: Surgery | Admitting: Surgery

## 2013-09-05 VITALS — BP 150/79 | HR 80 | Ht 64.0 in | Wt 178.0 lb

## 2013-09-05 DIAGNOSIS — I712 Thoracic aortic aneurysm, without rupture, unspecified: Secondary | ICD-10-CM

## 2013-09-05 MED ORDER — GADOBENATE DIMEGLUMINE 529 MG/ML IV SOLN: 16.0000 mL | Freq: Once | INTRAVENOUS | Status: DC | PRN

## 2013-09-05 MED ORDER — GADOBENATE DIMEGLUMINE 529 MG/ML IV SOLN
16.0000 mL | Freq: Once | INTRAVENOUS | Status: AC | PRN
  Administered 2013-09-05: 16 mL via INTRAVENOUS

## 2013-09-06 ENCOUNTER — Encounter: Payer: Self-pay | Admitting: Surgery

## 2013-09-06 NOTE — Progress Notes (Signed)
      HPI:  The patient returns to my office today for followup of an ascending aortic aneurysm. I last saw her on 0723/2013 at which time MR angiogram of the chest showed the maximum diameter of her aortic aneurysm to be 4.3 cm which was unchanged from 08/2010. She continues to feel well. She denies any chest or back pain. She's had no shortness of breath.    Current Outpatient Prescriptions  Medication Sig Dispense Refill  . CALCIUM PO Take 1 tablet by mouth daily.      . Cyanocobalamin (VITAMIN B-12 PO) Take 1 tablet by mouth daily.       Marland Kitchen losartan (COZAAR) 100 MG tablet Take 100 mg by mouth daily with breakfast.       . metoprolol succinate (TOPROL-XL) 25 MG 24 hr tablet Take 25 mg by mouth every evening. 5 pm      . Multiple Vitamin (MULTIVITAMIN WITH MINERALS) TABS Take 1 tablet by mouth daily.      . rosuvastatin (CRESTOR) 10 MG tablet Take 10 mg by mouth every other day.      . hydrochlorothiazide (HYDRODIURIL) 25 MG tablet Take 25 mg by mouth daily with breakfast.       . LORazepam (ATIVAN) 0.5 MG tablet Take 0.5 mg by mouth at bedtime as needed (sleep).        No current facility-administered medications for this visit.     Physical Exam: BP 150/79  Pulse 80  Ht 5\' 4"  (1.626 m)  Wt 178 lb (80.74 kg)  BMI 30.54 kg/m2  SpO2 97% She looks well.  Cardiac exam shows regular rate and rhythm with normal heart sounds. There is no murmur, rub, or gallop.  Lung exam is clear.   Diagnostic Tests:  CLINICAL DATA: Followup of aneurysmal disease of the thoracic  aorta.  EXAM:  MRA CHEST WITH OR WITHOUT CONTRAST  TECHNIQUE:  Angiographic images of the chest were obtained using MRA technique  without and with intravenous contrast.  CONTRAST: 70mL MULTIHANCE GADOBENATE DIMEGLUMINE 529 MG/ML IV SOLN  COMPARISON: 08/24/2011 and 08/03/2010.  FINDINGS:  There is stable mild aneurysmal disease of the ascending thoracic  aorta with maximal diameter of approximately 4.2- 4.3 cm.  The aorta  measures 3.8 cm at the sinuses of Valsalva. The proximal arch  measures 3.5 cm. The distal arch measures 3.0. The descending  thoracic aorta measures 2.8 cm. There is no evidence of aortic  dissection. Proximal great vessels show stable and normal patency.  No incidental abnormal findings in the visualized mediastinum or  lungs. No pleural or pericardial fluid is identified.  IMPRESSION:  Stable mild aneurysmal disease of the ascending thoracic aorta  measuring 4.2- 4.3 cm in greatest diameter.  Electronically Signed  By: Aletta Edouard M.D.  On: 09/05/2013 13:58         Impression:  The fusiform ascending aortic aneurysm is unchanged in size. She will continue her present medications.   Plan:  I will see her back in 2 years with a repeat MR angiogram of the chest.

## 2014-05-27 ENCOUNTER — Other Ambulatory Visit (HOSPITAL_COMMUNITY): Payer: Self-pay | Admitting: Family Medicine

## 2014-05-27 DIAGNOSIS — Z1231 Encounter for screening mammogram for malignant neoplasm of breast: Secondary | ICD-10-CM

## 2014-06-10 ENCOUNTER — Ambulatory Visit (HOSPITAL_COMMUNITY)
Admission: RE | Admit: 2014-06-10 | Discharge: 2014-06-10 | Disposition: A | Discharge status: Home / Self Care | Payer: Medicare HMO | Source: Ambulatory Visit | Attending: Family Medicine | Admitting: Family Medicine

## 2014-06-10 DIAGNOSIS — Z1231 Encounter for screening mammogram for malignant neoplasm of breast: Secondary | ICD-10-CM | POA: Diagnosis present

## 2014-10-23 NOTE — Patient Instructions (Signed)
Your procedure is scheduled on: 10/31/2014  Report to [facility] at  20  AM.  Call this number if you have problems the morning of surgery: [phone]   Do not eat food or drink liquids :After Midnight.      Take these medicines the morning of surgery with A SIP OF WATER: losartan, metoprolol   Do not wear jewelry, make-up or nail polish.  Do not wear lotions, powders, or perfumes. You may wear deodorant.  Do not shave 48 hours prior to surgery.  Do not bring valuables to the hospital.  Contacts, dentures or bridgework may not be worn into surgery.  Leave suitcase in the car. After surgery it may be brought to your room.  For patients admitted to the hospital, checkout time is 11:00 AM the day of discharge.   Patients discharged the day of surgery will not be allowed to drive home.  :     Please read over the following fact sheets that you were given: Coughing and Deep Breathing, Surgical Site Infection Prevention, Anesthesia Post-op Instructions and Care and Recovery After Surgery    Cataract A cataract is a clouding of the lens of the eye. When a lens becomes cloudy, vision is reduced based on the degree and nature of the clouding. Many cataracts reduce vision to some degree. Some cataracts make people more near-sighted as they develop. Other cataracts increase glare. Cataracts that are ignored and become worse can sometimes look white. The white color can be seen through the pupil. CAUSES   Aging. However, cataracts may occur at any age, even in newborns.   Certain drugs.   Trauma to the eye.   Certain diseases such as diabetes.   Specific eye diseases such as chronic inflammation inside the eye or a sudden attack of a rare form of glaucoma.   Inherited or acquired medical problems.  SYMPTOMS   Gradual, progressive drop in vision in the affected eye.   Severe, rapid visual loss. This most often happens when trauma is the cause.  DIAGNOSIS  To detect a cataract, an eye  doctor examines the lens. Cataracts are best diagnosed with an exam of the eyes with the pupils enlarged (dilated) by drops.  TREATMENT  For an early cataract, vision may improve by using different eyeglasses or stronger lighting. If that does not help your vision, surgery is the only effective treatment. A cataract needs to be surgically removed when vision loss interferes with your everyday activities, such as driving, reading, or watching TV. A cataract may also have to be removed if it prevents examination or treatment of another eye problem. Surgery removes the cloudy lens and usually replaces it with a substitute lens (intraocular lens, IOL).  At a time when both you and your doctor agree, the cataract will be surgically removed. If you have cataracts in both eyes, only one is usually removed at a time. This allows the operated eye to heal and be out of danger from any possible problems after surgery (such as infection or poor wound healing). In rare cases, a cataract may be doing damage to your eye. In these cases, your caregiver may advise surgical removal right away. The vast majority of people who have cataract surgery have better vision afterward. HOME CARE INSTRUCTIONS  If you are not planning surgery, you may be asked to do the following:  Use different eyeglasses.   Use stronger or brighter lighting.   Ask your eye doctor about reducing your medicine dose  or changing medicines if it is thought that a medicine caused your cataract. Changing medicines does not make the cataract go away on its own.   Become familiar with your surroundings. Poor vision can lead to injury. Avoid bumping into things on the affected side. You are at a higher risk for tripping or falling.   Exercise extreme care when driving or operating machinery.   Wear sunglasses if you are sensitive to bright light or experiencing problems with glare.  SEEK IMMEDIATE MEDICAL CARE IF:   You have a worsening or sudden  vision loss.   You notice redness, swelling, or increasing pain in the eye.   You have a fever.  Document Released: 01/18/2005 Document Revised: 01/07/2011 Document Reviewed: 09/11/2010 [facility] Patient Information 2012 [place].PATIENT INSTRUCTIONS POST-ANESTHESIA  IMMEDIATELY FOLLOWING SURGERY:  Do not drive or operate machinery for the first twenty four hours after surgery.  Do not make any important decisions for twenty four hours after surgery or while taking narcotic pain medications or sedatives.  If you develop intractable nausea and vomiting or a severe headache please notify your doctor immediately.  FOLLOW-UP:  Please make an appointment with your surgeon as instructed. You do not need to follow up with anesthesia unless specifically instructed to do so.  WOUND CARE INSTRUCTIONS (if applicable):  Keep a dry clean dressing on the anesthesia/puncture wound site if there is drainage.  Once the wound has quit draining you may leave it open to air.  Generally you should leave the bandage intact for twenty four hours unless there is drainage.  If the epidural site drains for more than 36-48 hours please call the anesthesia department.  QUESTIONS?:  Please feel free to call your physician or the hospital operator if you have any questions, and they will be happy to assist you.

## 2014-10-24 ENCOUNTER — Encounter (HOSPITAL_COMMUNITY): Payer: Self-pay

## 2014-10-24 ENCOUNTER — Other Ambulatory Visit: Payer: Self-pay

## 2014-10-24 ENCOUNTER — Encounter (HOSPITAL_COMMUNITY)
Admission: RE | Admit: 2014-10-24 | Discharge: 2014-10-24 | Disposition: A | Discharge status: Home / Self Care | Payer: Medicare HMO | Source: Ambulatory Visit | Attending: Ophthalmology | Admitting: Ophthalmology

## 2014-10-24 DIAGNOSIS — H02839 Dermatochalasis of unspecified eye, unspecified eyelid: Secondary | ICD-10-CM | POA: Insufficient documentation

## 2014-10-24 DIAGNOSIS — Z01818 Encounter for other preprocedural examination: Secondary | ICD-10-CM | POA: Insufficient documentation

## 2014-10-24 HISTORY — DX: Aortic aneurysm of unspecified site, without rupture: I71.9

## 2014-10-24 HISTORY — DX: Aneurysm of unspecified site: I72.9

## 2014-10-24 LAB — BASIC METABOLIC PANEL
Anion gap: 7 (ref 5–15)
BUN: 18 mg/dL (ref 6–20)
CALCIUM: 8.6 mg/dL — AB (ref 8.9–10.3)
CHLORIDE: 100 mmol/L — AB (ref 101–111)
CO2: 26 mmol/L (ref 22–32)
CREATININE: 0.78 mg/dL (ref 0.44–1.00)
GFR calc non Af Amer: >60 mL/min (ref >60)
GLUCOSE: 107 mg/dL — AB (ref 65–99)
Potassium: 3.8 mmol/L (ref 3.5–5.1)
Sodium: 133 mmol/L — ABNORMAL LOW (ref 135–145)

## 2014-10-24 LAB — CBC WITH DIFFERENTIAL/PLATELET
BASOS PCT: 0 %
Basophils Absolute: 0 10*3/uL (ref 0.0–0.1)
Eosinophils Absolute: 0.2 10*3/uL (ref 0.0–0.7)
Eosinophils Relative: 2 %
HEMATOCRIT: 37 % (ref 36.0–46.0)
Hemoglobin: 12.9 g/dL (ref 12.0–15.0)
Lymphocytes Relative: 35 %
Lymphs Abs: 2.7 10*3/uL (ref 0.7–4.0)
MCH: 31.9 pg (ref 26.0–34.0)
MCHC: 34.9 g/dL (ref 30.0–36.0)
MCV: 91.6 fL (ref 78.0–100.0)
MONO ABS: 0.5 10*3/uL (ref 0.1–1.0)
MONOS PCT: 6 %
NEUTROS ABS: 4.2 10*3/uL (ref 1.7–7.7)
Neutrophils Relative %: 57 %
Platelets: 322 10*3/uL (ref 150–400)
RBC: 4.04 MIL/uL (ref 3.87–5.11)
RDW: 11.7 % (ref 11.5–15.5)
WBC: 7.5 10*3/uL (ref 4.0–10.5)

## 2014-10-31 ENCOUNTER — Encounter (HOSPITAL_COMMUNITY)
Admission: RE | Disposition: A | Discharge status: Home / Self Care | Payer: Self-pay | Source: Ambulatory Visit | Attending: Ophthalmology

## 2014-10-31 ENCOUNTER — Ambulatory Visit (HOSPITAL_COMMUNITY): Payer: Medicare HMO | Admitting: Anesthesiology

## 2014-10-31 ENCOUNTER — Encounter (HOSPITAL_COMMUNITY): Payer: Self-pay | Admitting: *Deleted

## 2014-10-31 ENCOUNTER — Ambulatory Visit (HOSPITAL_COMMUNITY)
Admission: RE | Admit: 2014-10-31 | Discharge: 2014-10-31 | Disposition: A | Discharge status: Home / Self Care | Payer: Medicare HMO | Source: Ambulatory Visit | Attending: Ophthalmology | Admitting: Ophthalmology

## 2014-10-31 DIAGNOSIS — I10 Essential (primary) hypertension: Secondary | ICD-10-CM | POA: Insufficient documentation

## 2014-10-31 DIAGNOSIS — H02831 Dermatochalasis of right upper eyelid: Secondary | ICD-10-CM | POA: Diagnosis not present

## 2014-10-31 DIAGNOSIS — F418 Other specified anxiety disorders: Secondary | ICD-10-CM | POA: Diagnosis not present

## 2014-10-31 DIAGNOSIS — Z79899 Other long term (current) drug therapy: Secondary | ICD-10-CM | POA: Insufficient documentation

## 2014-10-31 DIAGNOSIS — H02834 Dermatochalasis of left upper eyelid: Secondary | ICD-10-CM | POA: Insufficient documentation

## 2014-10-31 DIAGNOSIS — J449 Chronic obstructive pulmonary disease, unspecified: Secondary | ICD-10-CM | POA: Insufficient documentation

## 2014-10-31 HISTORY — PX: BROW LIFT: SHX178

## 2014-10-31 SURGERY — BLEPHAROPLASTY
Anesthesia: Monitor Anesthesia Care | Site: Eye | Laterality: Bilateral

## 2014-10-31 MED ORDER — MIDAZOLAM HCL 2 MG/2ML IJ SOLN
1.0000 mg | INTRAMUSCULAR | Status: DC | PRN
  Administered 2014-10-31 (×2): 1 mg via INTRAVENOUS

## 2014-10-31 MED ORDER — MIDAZOLAM HCL 2 MG/2ML IJ SOLN
INTRAMUSCULAR | Status: AC
  Filled 2014-10-31: qty 4

## 2014-10-31 MED ORDER — LACTATED RINGERS IV SOLN
INTRAVENOUS | Status: DC
  Administered 2014-10-31: 07:00:00 via INTRAVENOUS

## 2014-10-31 MED ORDER — 0.9 % SODIUM CHLORIDE (POUR BTL) OPTIME
TOPICAL | Status: DC | PRN
  Administered 2014-10-31: 1000 mL

## 2014-10-31 MED ORDER — TETRACAINE HCL 0.5 % OP SOLN
OPHTHALMIC | Status: DC | PRN
  Administered 2014-10-31: 2 [drp] via OPHTHALMIC

## 2014-10-31 MED ORDER — BACITRACIN 500 UNIT/GM EX OINT
TOPICAL_OINTMENT | CUTANEOUS | Status: DC | PRN
  Administered 2014-10-31: 2 via TOPICAL

## 2014-10-31 MED ORDER — TETRACAINE HCL 0.5 % OP SOLN
OPHTHALMIC | Status: AC
  Filled 2014-10-31: qty 2

## 2014-10-31 MED ORDER — MIDAZOLAM HCL 5 MG/5ML IJ SOLN
INTRAMUSCULAR | Status: DC | PRN
  Administered 2014-10-31 (×2): 1 mg via INTRAVENOUS

## 2014-10-31 MED ORDER — ACETAMINOPHEN 500 MG PO TABS
500.0000 mg | ORAL_TABLET | Freq: Once | ORAL | Status: AC
  Administered 2014-10-31: 500 mg via ORAL

## 2014-10-31 MED ORDER — BACITRACIN ZINC 500 UNIT/GM EX OINT
TOPICAL_OINTMENT | CUTANEOUS | Status: AC
  Filled 2014-10-31: qty 1.8

## 2014-10-31 MED ORDER — LIDOCAINE-EPINEPHRINE (PF) 1 %-1:200000 IJ SOLN
INTRAMUSCULAR | Status: DC | PRN
  Administered 2014-10-31: 3 mL

## 2014-10-31 MED ORDER — MIDAZOLAM HCL 2 MG/2ML IJ SOLN
INTRAMUSCULAR | Status: AC
  Filled 2014-10-31: qty 2

## 2014-10-31 MED ORDER — ACETAMINOPHEN 500 MG PO TABS
ORAL_TABLET | ORAL | Status: AC
  Filled 2014-10-31: qty 1

## 2014-10-31 MED ORDER — LIDOCAINE-EPINEPHRINE (PF) 1 %-1:200000 IJ SOLN
INTRAMUSCULAR | Status: AC
  Filled 2014-10-31: qty 10

## 2014-10-31 MED ORDER — PROPOFOL 10 MG/ML IV BOLUS
INTRAVENOUS | Status: AC
  Filled 2014-10-31: qty 20

## 2014-10-31 MED ORDER — FENTANYL CITRATE (PF) 100 MCG/2ML IJ SOLN
INTRAMUSCULAR | Status: AC
  Filled 2014-10-31: qty 2

## 2014-10-31 MED ORDER — FENTANYL CITRATE (PF) 100 MCG/2ML IJ SOLN
25.0000 ug | INTRAMUSCULAR | Status: AC
  Administered 2014-10-31 (×2): 25 ug via INTRAVENOUS

## 2014-10-31 SURGICAL SUPPLY — 38 items
APPLICATOR COTTON TIP 6IN STRL (MISCELLANEOUS) IMPLANT
BAG HAMPER (MISCELLANEOUS) ×3 IMPLANT
BLADE SURG 15 STRL LF DISP TIS (BLADE) ×1 IMPLANT
BLADE SURG 15 STRL SS (BLADE) ×3
CLOTH BEACON ORANGE TIMEOUT ST (SAFETY) ×3 IMPLANT
COVER LIGHT HANDLE STERIS (MISCELLANEOUS) ×6 IMPLANT
COVER TABLE BACK 60X90 (DRAPES) ×3 IMPLANT
DRAPE ORTHO 2.5IN SPLIT 77X108 (DRAPES) ×1 IMPLANT
DRAPE ORTHO SPLIT 77X108 STRL (DRAPES) ×3
ELECT BLADE TIP CTD 4 INCH (ELECTRODE) ×3 IMPLANT
FORMALIN 10 PREFIL 120ML (MISCELLANEOUS) ×3 IMPLANT
GAUZE SPONGE 4X4 12PLY STRL (GAUZE/BANDAGES/DRESSINGS) ×5 IMPLANT
GLOVE BIOGEL PI IND STRL 6.5 (GLOVE) ×1 IMPLANT
GLOVE BIOGEL PI IND STRL 7.0 (GLOVE) IMPLANT
GLOVE BIOGEL PI IND STRL 7.5 (GLOVE) ×1 IMPLANT
GLOVE BIOGEL PI INDICATOR 6.5 (GLOVE) ×2
GLOVE BIOGEL PI INDICATOR 7.0 (GLOVE) ×2
GLOVE BIOGEL PI INDICATOR 7.5 (GLOVE) ×2
GLOVE ECLIPSE 6.5 STRL STRAW (GLOVE) ×2 IMPLANT
GLOVE EXAM NITRILE MD LF STRL (GLOVE) ×2 IMPLANT
GLOVE SURG SIGNA 7.5 PF LTX (GLOVE) ×3 IMPLANT
GOWN STRL REUS W/ TWL LRG LVL3 (GOWN DISPOSABLE) ×1 IMPLANT
GOWN STRL REUS W/TWL LRG LVL3 (GOWN DISPOSABLE) ×6 IMPLANT
KIT BLADEGUARD II DBL (SET/KITS/TRAYS/PACK) ×3 IMPLANT
KIT ROOM TURNOVER APOR (KITS) ×3 IMPLANT
KIT SURGICAL DEVON (SET/KITS/TRAYS/PACK) ×3 IMPLANT
MARKER SKIN DUAL TIP RULER LAB (MISCELLANEOUS) ×3 IMPLANT
NDL HYPO 25X1 1.5 SAFETY (NEEDLE) ×1 IMPLANT
NEEDLE HYPO 25X1 1.5 SAFETY (NEEDLE) ×3 IMPLANT
PACK BASIC III (CUSTOM PROCEDURE TRAY) ×3
PACK SRG BSC III STRL LF ECLPS (CUSTOM PROCEDURE TRAY) ×1 IMPLANT
PENCIL HANDSWITCHING (ELECTRODE) ×3 IMPLANT
SET BASIN LINEN APH (SET/KITS/TRAYS/PACK) ×3 IMPLANT
SUT 6 0 PLAIN (SUTURE) ×3 IMPLANT
SUT ETHILON NAB P1 5-0 18IN (SUTURE) ×1 IMPLANT
SUT VIC AB 5-0 P-3 18X BRD (SUTURE) IMPLANT
SUT VIC AB 5-0 P3 18 (SUTURE)
SYR CONTROL 10ML LL (SYRINGE) ×3 IMPLANT

## 2014-10-31 NOTE — Discharge Instructions (Signed)
Incision Care An incision is when a surgeon cuts into your body tissues. After surgery, the incision needs to be cared for properly to prevent infection.  HOME CARE INSTRUCTIONS   Take all medicine as directed by your caregiver. Only take over-the-counter or prescription medicines for pain, discomfort, or fever as directed by your caregiver.  Do not remove your bandage (dressing) or get your incision wet until your surgeon gives you permission. In the event that your dressing becomes wet, dirty, or starts to smell, change the dressing and call your surgeon for instructions as soon as possible.  Take showers. Do not take tub baths, swim, or do anything that may soak the wound until it is healed.  Resume your normal diet and activities as directed or allowed.  Avoid lifting any weight until you are instructed otherwise.  Use anti-itch antihistamine medicine as directed by your caregiver. The wound may itch when it is healing. Do not pick or scratch at the wound.  Follow up with your caregiver for stitch (suture) or staple removal as directed.  Drink enough fluids to keep your urine clear or pale yellow. SEEK MEDICAL CARE IF:   You have redness, swelling, or increasing pain in the wound that is not controlled with medicine.  You have drainage, blood, or pus coming from the wound that lasts longer than 1 day.  You develop muscle aches, chills, or a general ill feeling.  You notice a bad smell coming from the wound or dressing.  Your wound edges separate after the sutures, staples, or skin adhesive strips have been removed.  You develop persistent nausea or vomiting. SEEK IMMEDIATE MEDICAL CARE IF:   You have a fever.  You develop a rash.  You develop dizzy episodes or faint while standing.  You have difficulty breathing.  You develop any reaction or side effects to medicine given. MAKE SURE YOU:   Understand these instructions.  Will watch your condition.  Will get help  right away if you are not doing well or get worse. Document Released: 08/07/2004 Document Revised: 04/12/2011 Document Reviewed: 03/14/2013 [facility] Patient Information 2015 [place], [place]. This information is not intended to replace advice given to you by your health care provider. Make sure you discuss any questions you have with your health care provider.   Anesthesia, Care After Refer to this sheet in the next few weeks. These instructions provide you with information on caring for yourself after your procedure. Your health care provider may also give you more specific instructions. Your treatment has been planned according to current medical practices, but problems sometimes occur. Call your health care provider if you have any problems or questions after your procedure. WHAT TO EXPECT AFTER THE PROCEDURE After the procedure, it is typical to experience:  Sleepiness.  Nausea and vomiting. HOME CARE INSTRUCTIONS  For the first 24 hours after general anesthesia:  Have a responsible person with you.  Do not drive a car. If you are alone, do not take public transportation.  Do not drink alcohol.  Do not take medicine that has not been prescribed by your health care provider.  Do not sign important papers or make important decisions.  You may resume a normal diet and activities as directed by your health care provider.  Change bandages (dressings) as directed.  If you have questions or problems that seem related to general anesthesia, call the hospital and ask for the anesthetist or anesthesiologist on call. SEEK MEDICAL CARE IF:  You have nausea and  vomiting that continue the day after anesthesia.  You develop a rash. SEEK IMMEDIATE MEDICAL CARE IF:   You have difficulty breathing.  You have chest pain.  You have any allergic problems. Document Released: 04/26/2000 Document Revised: 01/23/2013 Document Reviewed: 08/03/2012 [facility] Patient Information 2015 [place],  [place]. This information is not intended to replace advice given to you by your health care provider. Make sure you discuss any questions you have with your health care provider.  Use ointment that Dr Doreatha Lew gave you as directed and ice pack on 20 mins then off 20 mins per Dr Marisa Hua

## 2014-10-31 NOTE — Transfer of Care (Signed)
Immediate Anesthesia Transfer of Care Note  Patient: Kelli Rodriguez  Procedure(s) Performed: Procedure(s) (LRB): BLEPHAROPLASTY (Bilateral)  Patient Location: Shortstay  Anesthesia Type: MAC  Level of Consciousness: awake  Airway & Oxygen Therapy: Patient Spontanous Breathing   Post-op Assessment: Report given to PACU RN, Post -op Vital signs reviewed and stable and Patient moving all extremities  Post vital signs: Reviewed and stable  Complications: No apparent anesthesia complications

## 2014-10-31 NOTE — H&P (Signed)
The H and P was reviewed and updated.  No changes were found after exam.  The surgical eyes were marked.

## 2014-10-31 NOTE — Anesthesia Procedure Notes (Signed)
Procedure Name: MAC Date/Time: 10/31/2014 7:39 AM Performed by: Vista Deck Pre-anesthesia Checklist: Patient identified, Emergency Drugs available, Suction available, Timeout performed and Patient being monitored Patient Re-evaluated:Patient Re-evaluated prior to inductionOxygen Delivery Method: Nasal Cannula

## 2014-10-31 NOTE — Anesthesia Postprocedure Evaluation (Signed)
  Anesthesia Post-op Note  Patient: Kelli Rodriguez  Procedure(s) Performed: Procedure(s) (LRB): BLEPHAROPLASTY (Bilateral)  Patient Location:  Short Stay  Anesthesia Type: MAC  Level of Consciousness: awake  Airway and Oxygen Therapy: Patient Spontanous Breathing  Post-op Pain: none  Post-op Assessment: Post-op Vital signs reviewed, Patient's Cardiovascular Status Stable, Respiratory Function Stable, Patent Airway, No signs of Nausea or vomiting and Pain level controlled  Post-op Vital Signs: Reviewed and stable  Complications: No apparent anesthesia complications

## 2014-10-31 NOTE — Op Note (Signed)
Procedure Date:   Preoperative Diagnosis: Visually significant dermatochalasis, both eyes   Postoperative Diagnosis:  Visually significant dermatochalasis, both eyes   Procedure Performed:  Bilateral upper lid blepharoplasty    Surgeon: Baruch Goldmann, M.D., M.A.  Assistants: None  Anesthesia:  1. Local infiltrative 1% lidocaine with epinephrine 1:100,000 with hyaluronidase,  2. MAC  Specimens: None   Estimated Blood Loss: Less than 1 mL   Complications: None    Indications for Surgery: Visually significant dermatochalasis of both eyes  Procedure: The patient was brought back to the operating room and was placed in the supine position. The patient was prepped and draped in the usual sterile fashion. Calipers were used to measure ?? mm above the upper lash line in both eyes. A forceps pinch technique was used to carefully measure the amount of skin to be taken off both eyes and care was taken that no lagophthalmos would likely result. From that point a surgical marking pen was used to mark the appropriate amount of skin to be taken from both eyes. Both surgical sites were then injected subcutaneously with local anesthetic (1% lidocaine with epinephrine 1:100,000). After adequate anesthesia was found to be achieved, attention was turned to the left side where a 15 blade was used to incise the skin along the premarked lines. After this was achieved, the cutaneous layer was removed using Westcott scissors and forceps.  Hemostasis was achieved with electocautery.  Three interrupted fast absorbing 6-0 gut sutures were placed. A running 6-0 gut suture was used to close the skin. After this was achieved, attention was turned to the right eye where a similar procedure was performed, including using a 15 blade to incise the skin, using Westcott scissors and forceps to remove the skin, cautery, and using 6-0 gut sutures, first interrupted and then running, to finish the wound.  After the wound was  closed and adequate hemostasis was found to be achieved, the drape was removed and patient was cleaned with wet and dry 4x4s, and bacitracin ointment was applied to both suture lines. At this point, it was noted that the right lateral running suture had come loose.  The patient was re-prepped and draped.  A 6-0 plain gut suture was started laterally and continued medially, and then tied to the end of the remaining intact suture.  The face was again cleaned and bacitracin ointment placed.  Post-Op Plan/Instructions: A prescription for erythromycin ointment was given. An instruction sheet was given. Ice packs are to be used for 48 hours 20 minutes on and 20 minutes off while awake for 2 days. Antibiotic ointment used for 10 days. A follow up visit will in two weeks in clinic. Patient instructed to call if signs of bleeding, infection, significant pain, or any other concerning symptoms  Disposition: Home under self care.

## 2014-10-31 NOTE — Anesthesia Preprocedure Evaluation (Signed)
Anesthesia Evaluation  Patient identified by MRN, date of birth, ID band Patient awake    Reviewed: Allergy & Precautions, NPO status , Patient's Chart, lab work & pertinent test results  Airway Mallampati: II  TM Distance: >3 FB     Dental  (+) Edentulous Upper, Edentulous Lower   Pulmonary COPD,    breath sounds clear to auscultation       Cardiovascular hypertension, Pt. on medications + Peripheral Vascular Disease (AAA)   Rhythm:Regular Rate:Normal     Neuro/Psych PSYCHIATRIC DISORDERS Anxiety    GI/Hepatic negative GI ROS,   Endo/Other    Renal/GU      Musculoskeletal   Abdominal   Peds  Hematology  (+) anemia ,   Anesthesia Other Findings   Reproductive/Obstetrics                             Anesthesia Physical Anesthesia Plan  ASA: III  Anesthesia Plan: MAC   Post-op Pain Management:    Induction: Intravenous  Airway Management Planned:   Additional Equipment:   Intra-op Plan:   Post-operative Plan:   Informed Consent: I have reviewed the patients History and Physical, chart, labs and discussed the procedure including the risks, benefits and alternatives for the proposed anesthesia with the patient or authorized representative who has indicated his/her understanding and acceptance.     Plan Discussed with:   Anesthesia Plan Comments:         Anesthesia Quick Evaluation

## 2014-11-01 ENCOUNTER — Encounter (HOSPITAL_COMMUNITY): Payer: Self-pay | Admitting: Ophthalmology

## 2014-12-06 ENCOUNTER — Encounter: Payer: Self-pay | Admitting: Pediatrics

## 2014-12-06 ENCOUNTER — Ambulatory Visit (INDEPENDENT_AMBULATORY_CARE_PROVIDER_SITE_OTHER): Payer: Medicare HMO | Admitting: Pediatrics

## 2014-12-06 VITALS — BP 134/74 | HR 71 | Temp 97.6°F | Ht 64.0 in | Wt 187.4 lb

## 2014-12-06 DIAGNOSIS — I712 Thoracic aortic aneurysm, without rupture: Secondary | ICD-10-CM | POA: Diagnosis not present

## 2014-12-06 DIAGNOSIS — E785 Hyperlipidemia, unspecified: Secondary | ICD-10-CM

## 2014-12-06 DIAGNOSIS — I10 Essential (primary) hypertension: Secondary | ICD-10-CM | POA: Diagnosis not present

## 2014-12-06 DIAGNOSIS — E538 Deficiency of other specified B group vitamins: Secondary | ICD-10-CM

## 2014-12-06 DIAGNOSIS — R042 Hemoptysis: Secondary | ICD-10-CM | POA: Diagnosis not present

## 2014-12-06 DIAGNOSIS — I7121 Aneurysm of the ascending aorta, without rupture: Secondary | ICD-10-CM

## 2014-12-06 DIAGNOSIS — G4700 Insomnia, unspecified: Secondary | ICD-10-CM

## 2014-12-06 NOTE — Progress Notes (Signed)
Subjective:    Patient ID: Kelli Rodriguez, female    DOB: 01-29-1935, 80 y.o.   MRN: [id number]  CC: f/u multiple med problems  HPI: Kelli Rodriguez is a 80 y.o. female presenting on 12/06/2014 for New Patient (Initial Visit)  H/o hemoptysis, bronchitis vs bronchiectasis per chart review and pt. Started on oxygen at night. Stopped because pt asked for it to stop several years ago. No SOB now. Can participate in exercise regularly, not using any inhalers. No furhter episodes of hemoptysis, happened once 5 years ago, small amount of red-pink mucus, was hospitalized, had extensive work up done. Uses CPAP machine with air after having home OSA test.  BPs at home in the 110s-130s/60s-80s  Never smoker Walks regularly, also does exercise classes 3 times a week Takes B12 PO regularly  Wants to know if her cholesterol is well controlled  H/o aortic aneurysm, last imaging stable, f/u plan for 2 years.  Pt with history of breast cancer.  No personal hx of CVA, MI or stroke  ROS: All systems negative other than what is in HPI  Past Medical History Patient Active Problem List   Diagnosis Date Noted  . Ascending aortic aneurysm ([place]) 08/24/2011  . BREAST CANCER 07/09/2009  . Hyperlipidemia 07/09/2009  . BRONCHIECTASIS 07/09/2009  . Hemoptysis 07/09/2009  . Essential hypertension 07/08/2009   No family history of heart attacks or strokes No family history of colon cancer or breast cancer.  Social History   Social History  . Marital Status: Widowed    Spouse Name: N/A  . Number of Children: N/A  . Years of Education: N/A   Occupational History  . Not on file.   Social History Main Topics  . Smoking status: Never Smoker   . Smokeless tobacco: Never Used  . Alcohol Use: No  . Drug Use: No  . Sexual Activity: Not on file   Other Topics Concern  . Not on file   Social History Narrative     Current Outpatient Prescriptions  Medication Sig Dispense Refill  .  Cyanocobalamin (VITAMIN B-12 PO) Take 1 tablet by mouth daily.     . hydrochlorothiazide (HYDRODIURIL) 25 MG tablet Take 25 mg by mouth daily with breakfast.     . losartan (COZAAR) 100 MG tablet Take 100 mg by mouth daily with breakfast.     . metoprolol succinate (TOPROL-XL) 25 MG 24 hr tablet Take 25 mg by mouth every evening. 5 pm    . Multiple Vitamin (MULTIVITAMIN WITH MINERALS) TABS Take 1 tablet by mouth daily.    . pravastatin (PRAVACHOL) 40 MG tablet Take 40 mg by mouth daily.    Marland Kitchen zolpidem (AMBIEN) 10 MG tablet Take 5 mg by mouth.     No current facility-administered medications for this visit.       Objective:    BP 134/74 mmHg  Pulse 71  Temp(Src) 97.6 F (36.4 C) (Oral)  Ht 5' 4"  (1.626 m)  Wt 187 lb 6.4 oz (85.004 kg)  BMI 32.15 kg/m2  SpO2 93%  Wt Readings from Last 3 Encounters:  12/06/14 187 lb 6.4 oz (85.004 kg)  09/05/13 178 lb (80.74 kg)  08/11/12 175 lb 6.4 oz (79.561 kg)    Gen: NAD, alert, cooperative with exam, NCAT EYES: EOMI, no scleral injection or icterus ENT:  TMs pearly gray b/l, OP without erythema LYMPH: no cervical LAD CV: NRRR, normal S1/S2, no murmur, WWP Resp: CTABL, no wheezes, normal WOB Abd: +BS, soft,  NTND. no guarding or organomegaly Ext: No edema, warm Neuro: Alert and oriented, strength equal b/l UE and LE, coordination grossly normal     Assessment & Plan:    Kelli Rodriguez was seen today for multiple medical problem f/u.  Diagnoses and all orders for this visit:  Essential hypertension Well controlled today. Continue current meds, check labs as below. -     BMP8+EGFR  Ascending aortic aneurysm ([facility]) Stable. FOllowed by CT surg. Next scan in 2018.  Hemoptysis None recent. Possibly due to bronchiectasis, not able to see any prior CT scans. Pt with no symptoms now, normal exam. Will continue to monitor.  Hyperlipidemia continue pravastatin, check lipid panel -     Lipid panel  Vitamin B 12 deficiency Takes B12 daily. WIll  check levels as below. -     B12 and Folate Panel  Insomnia Takes 78m ambien as needed. Continue. Not due for refill, none sent in today.  Follow up plan: Return in about 4 months (around 04/05/2015).  CAssunta Found MD [facility] 12/06/2014, 9:21 AM

## 2014-12-07 LAB — B12 AND FOLATE PANEL
Folate: 9.5 ng/mL (ref >3.0)
Vitamin B-12: 1070 pg/mL — ABNORMAL HIGH (ref 211–946)

## 2014-12-07 LAB — BMP8+EGFR
BUN / CREAT RATIO: 15 (ref 11–26)
BUN: 13 mg/dL (ref 8–27)
CO2: 27 mmol/L (ref 18–29)
CREATININE: 0.86 mg/dL (ref 0.57–1.00)
Calcium: 9.3 mg/dL (ref 8.7–10.3)
Chloride: 95 mmol/L — ABNORMAL LOW (ref 97–106)
GFR calc Af Amer: 75 mL/min/{1.73_m2} (ref >59)
GFR calc non Af Amer: 65 mL/min/{1.73_m2} (ref >59)
GLUCOSE: 100 mg/dL — AB (ref 65–99)
POTASSIUM: 4.1 mmol/L (ref 3.5–5.2)
Sodium: 138 mmol/L (ref 136–144)

## 2014-12-07 LAB — LIPID PANEL
CHOL/HDL RATIO: 3.1 ratio (ref 0.0–4.4)
Cholesterol, Total: 178 mg/dL (ref 100–199)
HDL: 58 mg/dL (ref >39)
LDL Calculated: 87 mg/dL (ref 0–99)
Triglycerides: 164 mg/dL — ABNORMAL HIGH (ref 0–149)
VLDL Cholesterol Cal: 33 mg/dL (ref 5–40)

## 2015-02-24 ENCOUNTER — Telehealth: Payer: Self-pay | Admitting: Pediatrics

## 2015-02-24 DIAGNOSIS — G4700 Insomnia, unspecified: Secondary | ICD-10-CM

## 2015-02-24 MED ORDER — ZOLPIDEM TARTRATE 10 MG PO TABS: 5.0000 mg | ORAL_TABLET | Freq: Every evening | ORAL | Status: DC | PRN

## 2015-02-24 NOTE — Telephone Encounter (Signed)
I printed out the script, ready for her to come by and pick up. Because she takes just half a tab I had to switch it to 15 tabs in a month because I can't prescribe more than a month's worth of medication at a time.

## 2015-02-24 NOTE — Telephone Encounter (Signed)
Done

## 2015-03-28 ENCOUNTER — Other Ambulatory Visit: Payer: Self-pay | Admitting: Pediatrics

## 2015-03-28 MED ORDER — LOSARTAN POTASSIUM 100 MG PO TABS: 100.0000 mg | ORAL_TABLET | Freq: Every day | ORAL | Status: DC

## 2015-03-28 MED ORDER — HYDROCHLOROTHIAZIDE 25 MG PO TABS: 25.0000 mg | ORAL_TABLET | Freq: Every day | ORAL | Status: DC

## 2015-03-28 NOTE — Telephone Encounter (Signed)
rx for cpap supplies written for pickup , meds sent electronically

## 2015-04-07 ENCOUNTER — Ambulatory Visit: Payer: Medicare HMO | Admitting: Pediatrics

## 2015-04-17 ENCOUNTER — Encounter: Payer: Self-pay | Admitting: Pediatrics

## 2015-04-17 ENCOUNTER — Ambulatory Visit (INDEPENDENT_AMBULATORY_CARE_PROVIDER_SITE_OTHER): Payer: PPO | Admitting: Pediatrics

## 2015-04-17 VITALS — BP 145/73 | HR 76 | Temp 97.3°F | Ht 64.0 in | Wt 185.0 lb

## 2015-04-17 DIAGNOSIS — E785 Hyperlipidemia, unspecified: Secondary | ICD-10-CM

## 2015-04-17 DIAGNOSIS — G4700 Insomnia, unspecified: Secondary | ICD-10-CM | POA: Diagnosis not present

## 2015-04-17 DIAGNOSIS — I10 Essential (primary) hypertension: Secondary | ICD-10-CM | POA: Diagnosis not present

## 2015-04-17 MED ORDER — ZOLPIDEM TARTRATE 10 MG PO TABS: 5.0000 mg | ORAL_TABLET | Freq: Every evening | ORAL | Status: DC | PRN

## 2015-04-17 NOTE — Progress Notes (Signed)
    Subjective:    Patient ID: Kelli Rodriguez, female    DOB: [birth year], 81 y.o.   MRN: [id number]  CC: Follow-up med problems  HPI: Kelli Rodriguez is a 81 y.o. female presenting for Follow-up  HTN: at home 144/70s is highest, lowest is 120s/70s  Insomnia: if she doesn't take Lorrin Mais has a hard time falling and staying asleep. Has tried melatonin. As long as she takes [place] 5mg  she does ok with sleeping Goes to bed at 10:30pm nightly Wakes up 7 am No naps  Has CPAP machine at home  Depression screen [facility] 2/9 12/06/2014  Decreased Interest 0  Down, Depressed, Hopeless 0  PHQ - 2 Score 0     Relevant past medical, surgical, family and social history reviewed and updated as indicated. Interim medical history since our last visit reviewed. Allergies and medications reviewed and updated.    ROS: Per HPI unless specifically indicated above  History  Smoking status  . Never Smoker   Smokeless tobacco  . Never Used    Past Medical History Patient Active Problem List   Diagnosis Date Noted  . Insomnia 12/06/2014  . Vitamin B 12 deficiency 12/06/2014  . Ascending aortic aneurysm ([place]) 08/24/2011  . BREAST CANCER 07/09/2009  . Hyperlipidemia 07/09/2009  . BRONCHIECTASIS 07/09/2009  . Hemoptysis 07/09/2009  . Essential hypertension 07/08/2009       Objective:    BP 145/73 mmHg  Pulse 76  Temp(Src) 97.3 F (36.3 C) (Oral)  Ht 5\' 4"  (1.626 m)  Wt 185 lb (83.915 kg)  BMI 31.74 kg/m2  Wt Readings from Last 3 Encounters:  04/17/15 185 lb (83.915 kg)  12/06/14 187 lb 6.4 oz (85.004 kg)  09/05/13 178 lb (80.74 kg)     Gen: NAD, alert, cooperative with exam, NCAT EYES: EOMI, no scleral injection or icterus ENT:  TMs pearly gray b/l, OP without erythema LYMPH: no cervical LAD CV: NRRR, normal S1/S2, no murmur Resp: CTABL, no wheezes, normal WOB Abd: +BS, soft, NTND. no guarding or organomegaly Ext: No edema, warm Neuro: Alert and oriented, strength equal  b/l UE and LE, coordination grossly normal     Assessment & Plan:    Zikia was seen today for follow-up multiple med problems  Diagnoses and all orders for this visit:  Insomnia -     zolpidem (AMBIEN) 10 MG tablet; Take 0.5 tablets (5 mg total) by mouth at bedtime as needed for sleep.  Hyperlipidemia Continue pravastatin  Essential hypertension Continue to check BPs at home. Let me knwo if regularly elevated   Follow up plan: Return in about 6 months (around 10/18/2015).  Assunta Found, MD [facility] 04/17/2015, 4:29 PM

## 2015-06-03 ENCOUNTER — Other Ambulatory Visit: Payer: Self-pay | Admitting: Pediatrics

## 2015-06-03 DIAGNOSIS — Z1231 Encounter for screening mammogram for malignant neoplasm of breast: Secondary | ICD-10-CM

## 2015-06-11 ENCOUNTER — Other Ambulatory Visit: Payer: Self-pay | Admitting: Pediatrics

## 2015-06-11 ENCOUNTER — Ambulatory Visit (HOSPITAL_COMMUNITY)
Admission: RE | Admit: 2015-06-11 | Discharge: 2015-06-11 | Disposition: A | Discharge status: Home / Self Care | Payer: PPO | Source: Ambulatory Visit | Attending: Pediatrics | Admitting: Pediatrics

## 2015-06-11 DIAGNOSIS — Z1231 Encounter for screening mammogram for malignant neoplasm of breast: Secondary | ICD-10-CM | POA: Insufficient documentation

## 2015-07-07 ENCOUNTER — Encounter: Payer: Self-pay | Admitting: *Deleted

## 2015-07-15 ENCOUNTER — Ambulatory Visit (INDEPENDENT_AMBULATORY_CARE_PROVIDER_SITE_OTHER): Payer: PPO | Admitting: Family Medicine

## 2015-07-15 ENCOUNTER — Encounter: Payer: Self-pay | Admitting: Family Medicine

## 2015-07-15 VITALS — BP 150/83 | HR 68 | Temp 97.1°F | Ht 64.0 in | Wt 183.4 lb

## 2015-07-15 DIAGNOSIS — E2839 Other primary ovarian failure: Secondary | ICD-10-CM | POA: Diagnosis not present

## 2015-07-15 DIAGNOSIS — E785 Hyperlipidemia, unspecified: Secondary | ICD-10-CM | POA: Diagnosis not present

## 2015-07-15 DIAGNOSIS — J479 Bronchiectasis, uncomplicated: Secondary | ICD-10-CM

## 2015-07-15 DIAGNOSIS — I10 Essential (primary) hypertension: Secondary | ICD-10-CM

## 2015-07-15 MED ORDER — PRAVASTATIN SODIUM 40 MG PO TABS: 40.0000 mg | ORAL_TABLET | Freq: Every day | ORAL | Status: DC

## 2015-07-15 MED ORDER — LOSARTAN POTASSIUM 100 MG PO TABS: 100.0000 mg | ORAL_TABLET | Freq: Every day | ORAL | Status: AC

## 2015-07-15 NOTE — Patient Instructions (Signed)
Great to meet you!  We will call with lab results within 1 week  I have refilled your medications, losartan and pravastatin  Please call your vascular surgeon for follow up appointment for the aneurysm  We will send information to [facility] in [place] for CPAP equipment

## 2015-07-15 NOTE — Progress Notes (Signed)
   HPI  Patient presents today here on the same day requesting blood work.  Hypertension Taking medications well, good compliance Average systolic blood pressure at home is 130s No chest pain, dyspnea, palpitations, leg edema.  Hyperlipidemia Not really watching diet, taking pravastatin every day. No myalgias.  Requests DEXA scan.  Also requests CPAP machine equipment, she states that you she uses this for COPD   PMH: Smoking status noted ROS: Per HPI  Objective: BP 150/83 mmHg  Pulse 68  Temp(Src) 97.1 F (36.2 C) (Oral)  Ht 5' 4" (1.626 m)  Wt 183 lb 6.4 oz (83.19 kg)  BMI 31.47 kg/m2 Gen: NAD, alert, cooperative with exam HEENT: NCAT CV: RRR, good S1/S2, no murmur Resp: CTABL, no wheezes, non-labored Neuro: Alert and oriented, No gross deficits  Assessment and plan:  # Hyperlipidemia Repeating labs, fasting Refilled pravastatin  # Hypertension Reasonably well-controlled for age Continue current medications losartan and metoprolol  # Estrogen deficiency, healthcare maintenance DEXA scan ordered  # Bronchiectasis Treated with CPAP, ordering equipment, requesting it be sent to [facility] [place]    Orders Placed This Encounter  Procedures  . Lipid Panel  . CMP14+EGFR  . CBC with Differential    Meds ordered this encounter  Medications  . losartan (COZAAR) 100 MG tablet    Sig: Take 1 tablet (100 mg total) by mouth daily with breakfast.    Dispense:  90 tablet    Refill:  3  . pravastatin (PRAVACHOL) 40 MG tablet    Sig: Take 1 tablet (40 mg total) by mouth daily.    Dispense:  90 tablet    Refill:  3    Sam Bradshaw, MD [facility] 07/15/2015, 9:36 AM      

## 2015-07-16 LAB — LIPID PANEL
CHOL/HDL RATIO: 3.2 ratio (ref 0.0–4.4)
Cholesterol, Total: 181 mg/dL (ref 100–199)
HDL: 57 mg/dL (ref >39)
LDL Calculated: 95 mg/dL (ref 0–99)
Triglycerides: 146 mg/dL (ref 0–149)
VLDL Cholesterol Cal: 29 mg/dL (ref 5–40)

## 2015-07-16 LAB — CBC WITH DIFFERENTIAL/PLATELET

## 2015-07-16 LAB — CMP14+EGFR
ALBUMIN: 4.3 g/dL (ref 3.5–4.8)
ALT: 15 IU/L (ref 0–32)
AST: 25 IU/L (ref 0–40)
Albumin/Globulin Ratio: 1.5 (ref 1.2–2.2)
Alkaline Phosphatase: 77 IU/L (ref 39–117)
BILIRUBIN TOTAL: 0.6 mg/dL (ref 0.0–1.2)
BUN/Creatinine Ratio: 19 (ref 12–28)
BUN: 18 mg/dL (ref 8–27)
CHLORIDE: 96 mmol/L (ref 96–106)
CO2: 23 mmol/L (ref 18–29)
CREATININE: 0.97 mg/dL (ref 0.57–1.00)
Calcium: 9.8 mg/dL (ref 8.7–10.3)
GFR calc non Af Amer: 56 mL/min/{1.73_m2} — ABNORMAL LOW (ref >59)
GFR, EST AFRICAN AMERICAN: 65 mL/min/{1.73_m2} (ref >59)
GLUCOSE: 102 mg/dL — AB (ref 65–99)
Globulin, Total: 2.8 g/dL (ref 1.5–4.5)
Potassium: 5.4 mmol/L — ABNORMAL HIGH (ref 3.5–5.2)
Sodium: 141 mmol/L (ref 134–144)
TOTAL PROTEIN: 7.1 g/dL (ref 6.0–8.5)

## 2015-07-18 ENCOUNTER — Other Ambulatory Visit: Payer: Self-pay | Admitting: *Deleted

## 2015-07-18 DIAGNOSIS — E875 Hyperkalemia: Secondary | ICD-10-CM

## 2015-07-23 ENCOUNTER — Ambulatory Visit (HOSPITAL_COMMUNITY)
Admission: RE | Admit: 2015-07-23 | Discharge: 2015-07-23 | Disposition: A | Discharge status: Home / Self Care | Payer: PPO | Source: Ambulatory Visit | Attending: Family Medicine | Admitting: Family Medicine

## 2015-07-23 DIAGNOSIS — Z13820 Encounter for screening for osteoporosis: Secondary | ICD-10-CM | POA: Insufficient documentation

## 2015-07-23 DIAGNOSIS — E2839 Other primary ovarian failure: Secondary | ICD-10-CM | POA: Insufficient documentation

## 2015-07-23 DIAGNOSIS — M85851 Other specified disorders of bone density and structure, right thigh: Secondary | ICD-10-CM | POA: Insufficient documentation

## 2015-07-23 DIAGNOSIS — Z780 Asymptomatic menopausal state: Secondary | ICD-10-CM | POA: Diagnosis not present

## 2015-07-31 ENCOUNTER — Other Ambulatory Visit: Payer: PPO

## 2015-07-31 DIAGNOSIS — E875 Hyperkalemia: Secondary | ICD-10-CM

## 2015-07-31 LAB — BMP8+EGFR
BUN / CREAT RATIO: 22 (ref 12–28)
BUN: 18 mg/dL (ref 8–27)
CALCIUM: 9.1 mg/dL (ref 8.7–10.3)
CO2: 18 mmol/L (ref 18–29)
Chloride: 98 mmol/L (ref 96–106)
Creatinine, Ser: 0.81 mg/dL (ref 0.57–1.00)
GFR, EST AFRICAN AMERICAN: 80 mL/min/{1.73_m2} (ref >59)
GFR, EST NON AFRICAN AMERICAN: 70 mL/min/{1.73_m2} (ref >59)
Glucose: 105 mg/dL — ABNORMAL HIGH (ref 65–99)
POTASSIUM: 4.1 mmol/L (ref 3.5–5.2)
Sodium: 136 mmol/L (ref 134–144)

## 2015-08-19 ENCOUNTER — Other Ambulatory Visit: Payer: Self-pay | Admitting: Surgery

## 2015-08-19 DIAGNOSIS — I7121 Aneurysm of the ascending aorta, without rupture: Secondary | ICD-10-CM

## 2015-08-19 DIAGNOSIS — I712 Thoracic aortic aneurysm, without rupture: Secondary | ICD-10-CM

## 2015-08-21 ENCOUNTER — Other Ambulatory Visit: Payer: Self-pay | Admitting: Surgery

## 2015-08-21 DIAGNOSIS — I7121 Aneurysm of the ascending aorta, without rupture: Secondary | ICD-10-CM

## 2015-08-21 DIAGNOSIS — I712 Thoracic aortic aneurysm, without rupture: Secondary | ICD-10-CM

## 2015-09-22 ENCOUNTER — Other Ambulatory Visit: Payer: PPO

## 2015-09-24 ENCOUNTER — Ambulatory Visit: Payer: PPO | Admitting: Surgery

## 2015-10-07 ENCOUNTER — Other Ambulatory Visit: Payer: PPO

## 2015-10-08 ENCOUNTER — Encounter: Payer: Self-pay | Admitting: Surgery

## 2015-10-08 ENCOUNTER — Ambulatory Visit
Admission: RE | Admit: 2015-10-08 | Discharge: 2015-10-08 | Disposition: A | Discharge status: Home / Self Care | Payer: PPO | Source: Ambulatory Visit | Attending: Surgery | Admitting: Surgery

## 2015-10-08 ENCOUNTER — Ambulatory Visit (INDEPENDENT_AMBULATORY_CARE_PROVIDER_SITE_OTHER): Payer: PPO | Admitting: Surgery

## 2015-10-08 VITALS — BP 158/75 | HR 84 | Resp 20 | Ht 64.0 in | Wt 185.0 lb

## 2015-10-08 DIAGNOSIS — I7121 Aneurysm of the ascending aorta, without rupture: Secondary | ICD-10-CM

## 2015-10-08 DIAGNOSIS — I712 Thoracic aortic aneurysm, without rupture: Secondary | ICD-10-CM

## 2015-10-08 MED ORDER — GADOBENATE DIMEGLUMINE 529 MG/ML IV SOLN
17.0000 mL | Freq: Once | INTRAVENOUS | Status: AC | PRN
  Administered 2015-10-08: 17 mL via INTRAVENOUS

## 2015-10-08 NOTE — Progress Notes (Signed)
     HPI:   The patient returns to my office today for followup of an ascending aortic aneurysm. I last saw her on 09/06/2013  at which time MR angiogram of the chest showed the maximum diameter of her aortic aneurysm to be 4.3 cm which was unchanged from 08/2010. She continues to feel well. She denies any chest or back pain. She's had no shortness of breath. She exercises 2 hrs at a time,  3 days per week and feels well.  Current Outpatient Prescriptions  Medication Sig Dispense Refill  . hydrochlorothiazide (HYDRODIURIL) 25 MG tablet Take 1 tablet (25 mg total) by mouth daily with breakfast. 90 tablet 0  . losartan (COZAAR) 100 MG tablet Take 1 tablet (100 mg total) by mouth daily with breakfast. 90 tablet 3  . metoprolol succinate (TOPROL-XL) 25 MG 24 hr tablet Take 25 mg by mouth every evening. 5 pm    . Multiple Vitamin (MULTIVITAMIN WITH MINERALS) TABS Take 1 tablet by mouth daily.    . pravastatin (PRAVACHOL) 40 MG tablet Take 1 tablet (40 mg total) by mouth daily. 90 tablet 3   No current facility-administered medications for this visit.      Physical Exam:  BP (!) 158/75 (BP Location: Left Arm, Patient Position: Standing, Cuff Size: Normal)   Pulse 84   Resp 20   Ht 5\' 4"  (1.626 m)   Wt 185 lb (83.9 kg)   SpO2 96% Comment: RA  BMI 31.76 kg/m  She looks well.  Cardiac exam shows regular rate and rhythm with normal heart sounds. There is no murmur, rub, or gallop.  Lung exam is clear.  Diagnostic Tests:  CLINICAL DATA:  STAT MRA chest w/wo: F/u aortic aneurysm. Prior MR. No symptoms today. Seeing Doctor today.  EXAM: MRA CHEST WITH OR WITHOUT CONTRAST  TECHNIQUE: Angiographic images of the chest were obtained using MRA technique without and with intravenous contrast.  CONTRAST:  58mL MULTIHANCE GADOBENATE DIMEGLUMINE 529 MG/ML IV SOLN .  Creatinine was obtained on site at [facility] at [address]  Results: Creatinine 0.9 mg/dL.   GFR=60.  COMPARISON:  09/05/2013  FINDINGS: No thoracic aortic dissection or stenosis. No significant atheromatous irregularity. Classic 3 vessel brachiocephalic arterial origin anatomy without proximal stenosis. Thoracic aortic measurements as follows:  3.4 cm sino-tubular junction  4.3 proximal ascending  4.1 distal ascending /proximal arch  2.8 cm distal arch/proximal descending  2.6 cm distal descending  Central pulmonary arteries unremarkable. Segmental and subsegmental branches incompletely evaluated.  No pleural or pericardial effusion. Focal soft tissue opacity density in the anterior right upper lobe, stable since 08/03/2010. Changes of right mastectomy with prosthesis. No hilar or mediastinal adenopathy localized. Limited visualized portions of upper abdomen unremarkable.  IMPRESSION: 1. Stable 4.3 cm ascending aortic aneurysm without complicating features.   Electronically Signed   By: Lucrezia Europe M.D.   On: 10/08/2015 12:04   Impression:  I have personally reviewed and interpreted her MRA of the chest. The patient has a stable 4.3 cm ascending aortic aneurysm that has not changed since 2012. I think it is fine to repeat her scan in two years. I reviewed the scan with her and answered her questions. I discussed the importance of good blood pressure control. She is on a beta blocker and ARB.  Plan:  Follow up in 2 years with MRA chest.   Gaye Pollack, MD [facility] [phone]

## 2015-10-24 ENCOUNTER — Ambulatory Visit: Payer: PPO | Admitting: Pediatrics

## 2015-10-24 ENCOUNTER — Ambulatory Visit: Payer: PPO | Admitting: Family Medicine

## 2015-10-28 ENCOUNTER — Ambulatory Visit: Payer: PPO | Admitting: Pediatrics

## 2015-12-08 ENCOUNTER — Other Ambulatory Visit: Payer: Self-pay | Admitting: *Deleted

## 2015-12-08 ENCOUNTER — Other Ambulatory Visit: Payer: Self-pay | Admitting: Pediatrics

## 2015-12-08 MED ORDER — METOPROLOL SUCCINATE ER 25 MG PO TB24: 25.0000 mg | ORAL_TABLET | Freq: Every evening | ORAL | 0 refills | Status: DC

## 2015-12-08 MED ORDER — HYDROCHLOROTHIAZIDE 25 MG PO TABS: 25.0000 mg | ORAL_TABLET | Freq: Every day | ORAL | 0 refills | Status: DC

## 2015-12-09 ENCOUNTER — Other Ambulatory Visit: Payer: Self-pay | Admitting: *Deleted

## 2016-02-13 ENCOUNTER — Telehealth: Payer: Self-pay | Admitting: Pediatrics

## 2016-02-13 ENCOUNTER — Ambulatory Visit (INDEPENDENT_AMBULATORY_CARE_PROVIDER_SITE_OTHER): Payer: Medicare HMO

## 2016-02-13 ENCOUNTER — Encounter: Payer: Self-pay | Admitting: Pediatrics

## 2016-02-13 ENCOUNTER — Ambulatory Visit (INDEPENDENT_AMBULATORY_CARE_PROVIDER_SITE_OTHER): Payer: Medicare HMO | Admitting: Pediatrics

## 2016-02-13 VITALS — BP 133/84 | HR 85 | Temp 98.4°F | Resp 22 | Ht 64.0 in | Wt 183.2 lb

## 2016-02-13 DIAGNOSIS — R0600 Dyspnea, unspecified: Secondary | ICD-10-CM | POA: Diagnosis not present

## 2016-02-13 DIAGNOSIS — R05 Cough: Secondary | ICD-10-CM | POA: Diagnosis not present

## 2016-02-13 DIAGNOSIS — R059 Cough, unspecified: Secondary | ICD-10-CM

## 2016-02-13 DIAGNOSIS — J181 Lobar pneumonia, unspecified organism: Secondary | ICD-10-CM

## 2016-02-13 DIAGNOSIS — J189 Pneumonia, unspecified organism: Secondary | ICD-10-CM

## 2016-02-13 MED ORDER — LEVOFLOXACIN 500 MG PO TABS: 500.0000 mg | ORAL_TABLET | Freq: Every day | ORAL | 0 refills | Status: DC

## 2016-02-13 NOTE — Progress Notes (Signed)
  Subjective:   Patient ID: Kelli Rodriguez, female    DOB: 09-Dec-1934, 82 y.o.   MRN: [id number] CC: Cough; Chest congestion; and Shortness of Breath  HPI: Kelli Rodriguez is a 82 y.o. female presenting for Cough; Chest congestion; and Shortness of Breath  Has been sick for about 6 days Cough has gotten worse productive Feeling SOB all the time No fevers, cold chills Appetite is down No chest pain  Relevant past medical, surgical, family and social history reviewed. Allergies and medications reviewed and updated. History  Smoking Status  . Never Smoker  Smokeless Tobacco  . Never Used   ROS: Per HPI   Objective:    BP 133/84   Pulse 85   Temp 98.4 F (36.9 C) (Oral)   Resp (!) 22   Ht 5\' 4"  (1.626 m)   Wt 183 lb 3.2 oz (83.1 kg)   SpO2 91%   BMI 31.45 kg/m   Wt Readings from Last 3 Encounters:  02/13/16 183 lb 3.2 oz (83.1 kg)  10/08/15 185 lb (83.9 kg)  07/15/15 183 lb 6.4 oz (83.2 kg)    Gen: NAD, alert, cooperative with exam, NCAT EYES: EOMI, no conjunctival injection, or no icterus ENT:  TMs dull gray b/l, OP without erythema LYMPH: no cervical LAD CV: NRRR, normal S1/S2, no murmur, distal pulses 2+ b/l Resp: moving air well, no wheezes, a few scattered rhonchi, no crackles Ext: No edema, warm Neuro: Alert and oriented MSK: normal muscle bulk  Assessment & Plan:  Kelli Rodriguez was seen today for cough, chest congestion and shortness of breath.  Diagnoses and all orders for this visit:  Dyspnea, unspecified type -     DG Chest 2 View; Future  Cough -     DG Chest 2 View; Future  Community acquired pneumonia of left lower lobe of lung ([place]) Will treat with below based on exam and symptoms CXR with no infiltrate Discussed return precautions, if any worsening symptoms must be seen -     levofloxacin (LEVAQUIN) 500 MG tablet; Take 1 tablet (500 mg total) by mouth daily.   Follow up plan: As needed Assunta Found, MD [place]

## 2016-02-13 NOTE — Telephone Encounter (Signed)
Patient called concerned with side effects of Levaquin.  Informed patient that if she starts to have any side effects to call this office

## 2016-03-03 ENCOUNTER — Other Ambulatory Visit: Payer: Self-pay | Admitting: Family Medicine

## 2016-04-10 ENCOUNTER — Other Ambulatory Visit: Payer: Self-pay | Admitting: Pediatrics

## 2016-05-11 ENCOUNTER — Other Ambulatory Visit: Payer: Self-pay | Admitting: Family Medicine

## 2016-05-24 ENCOUNTER — Other Ambulatory Visit: Payer: Self-pay | Admitting: Pediatrics

## 2016-05-24 DIAGNOSIS — Z1231 Encounter for screening mammogram for malignant neoplasm of breast: Secondary | ICD-10-CM

## 2016-06-07 ENCOUNTER — Other Ambulatory Visit: Payer: Self-pay | Admitting: Pediatrics

## 2016-06-09 ENCOUNTER — Telehealth: Payer: Self-pay | Admitting: Pediatrics

## 2016-06-09 ENCOUNTER — Other Ambulatory Visit: Payer: Self-pay | Admitting: *Deleted

## 2016-06-09 MED ORDER — METOPROLOL SUCCINATE ER 25 MG PO TB24: ORAL_TABLET | ORAL | 0 refills | Status: DC

## 2016-06-09 NOTE — Telephone Encounter (Signed)
Pt notified RX was sent into [facility]

## 2016-06-10 ENCOUNTER — Ambulatory Visit (INDEPENDENT_AMBULATORY_CARE_PROVIDER_SITE_OTHER): Payer: Medicare HMO | Admitting: Pediatrics

## 2016-06-10 ENCOUNTER — Encounter: Payer: Self-pay | Admitting: Pediatrics

## 2016-06-10 VITALS — BP 163/70 | HR 76 | Temp 97.5°F | Ht 64.0 in | Wt 184.2 lb

## 2016-06-10 DIAGNOSIS — J309 Allergic rhinitis, unspecified: Secondary | ICD-10-CM | POA: Diagnosis not present

## 2016-06-10 DIAGNOSIS — J069 Acute upper respiratory infection, unspecified: Secondary | ICD-10-CM

## 2016-06-10 DIAGNOSIS — I10 Essential (primary) hypertension: Secondary | ICD-10-CM | POA: Diagnosis not present

## 2016-06-10 DIAGNOSIS — R059 Cough, unspecified: Secondary | ICD-10-CM

## 2016-06-10 DIAGNOSIS — E785 Hyperlipidemia, unspecified: Secondary | ICD-10-CM | POA: Diagnosis not present

## 2016-06-10 DIAGNOSIS — R05 Cough: Secondary | ICD-10-CM | POA: Diagnosis not present

## 2016-06-10 MED ORDER — CETIRIZINE HCL 10 MG PO TABS: 10.0000 mg | ORAL_TABLET | Freq: Every day | ORAL | 0 refills | Status: DC

## 2016-06-10 MED ORDER — GUAIFENESIN-CODEINE 100-10 MG/5ML PO SOLN: 5.0000 mL | Freq: Three times a day (TID) | ORAL | 0 refills | Status: DC | PRN

## 2016-06-10 NOTE — Progress Notes (Signed)
  Subjective:   Patient ID: Kelli Rodriguez, female    DOB: [birth year], 82 y.o.   MRN: [id number] CC: Cough (x 2 days)  HPI: Kelli Rodriguez is a 82 y.o. female presenting for Cough (x 2 days)  Coughing at night Feels irritated in throat, upper chest No ear pain Normal appetite Feeling well otherwise No fevers  HTN: says usually better at home, 120s-130s at home No CP, HTN  HLD: on pravastatin, takes daily  Relevant past medical, surgical, family and social history reviewed. Allergies and medications reviewed and updated. History  Smoking Status  . Never Smoker  Smokeless Tobacco  . Never Used   ROS: Per HPI   Objective:    BP (!) 163/70   Pulse 76   Temp 97.5 F (36.4 C) (Oral)   Ht '5\' 4"'$  (1.626 m)   Wt 184 lb 3.2 oz (83.6 kg)   BMI 31.62 kg/m   Wt Readings from Last 3 Encounters:  06/10/16 184 lb 3.2 oz (83.6 kg)  02/13/16 183 lb 3.2 oz (83.1 kg)  10/08/15 185 lb (83.9 kg)    Gen: NAD, alert, cooperative with exam, NCAT EYES: EOMI, no conjunctival injection, or no icterus ENT:  TMs pearly gray b/l, OP with mild erythema LYMPH: no cervical LAD CV: NRRR, normal S1/S2, no murmur Resp: CTABL, no wheezes, normal WOB Abd: +BS, soft, NTND. no guarding or organomegaly Ext: No edema, warm Neuro: Alert and oriented  Assessment & Plan:  Kelli Rodriguez was seen today for cough, follow up  Diagnoses and all orders for this visit:  Essential hypertension Elevated, increased with repeated checks Usually lower at home Check at home, let me know if elevated Cont current meds -     BMP8+EGFR  Acute URI Cough  Discussed symptomatic care Start below for cough -     guaiFENesin-codeine 100-10 MG/5ML syrup; Take 5-10 mLs by mouth 3 (three) times daily as needed for cough.  Allergic rhinitis, unspecified seasonality, unspecified trigger Increased symptoms this time of year, take below -     cetirizine (ZYRTEC) 10 MG tablet; Take 1 tablet (10 mg total) by mouth  daily.  HLD On statin, continue  Follow up plan: Return in about 3 months (around 09/10/2016) for Blood pressure follow up. Assunta Found, MD [place]

## 2016-06-11 ENCOUNTER — Ambulatory Visit (HOSPITAL_COMMUNITY)
Admission: RE | Admit: 2016-06-11 | Discharge: 2016-06-11 | Disposition: A | Discharge status: Home / Self Care | Payer: Medicare HMO | Source: Ambulatory Visit | Attending: Pediatrics | Admitting: Pediatrics

## 2016-06-11 ENCOUNTER — Other Ambulatory Visit: Payer: Self-pay | Admitting: Pediatrics

## 2016-06-11 DIAGNOSIS — Z1231 Encounter for screening mammogram for malignant neoplasm of breast: Secondary | ICD-10-CM | POA: Insufficient documentation

## 2016-06-14 ENCOUNTER — Telehealth: Payer: Self-pay | Admitting: Pediatrics

## 2016-06-14 ENCOUNTER — Other Ambulatory Visit: Payer: Self-pay | Admitting: *Deleted

## 2016-06-14 DIAGNOSIS — J40 Bronchitis, not specified as acute or chronic: Secondary | ICD-10-CM

## 2016-06-14 MED ORDER — AZITHROMYCIN 250 MG PO TABS: ORAL_TABLET | ORAL | 0 refills | Status: DC

## 2016-06-14 NOTE — Telephone Encounter (Signed)
Patient called stating that cough and congestion has worsened and would like to have antibiotic sent to pharmacy.

## 2016-06-14 NOTE — Telephone Encounter (Signed)
What symptoms do you have? Was seen on may 10th. She now has congestion in her chest. Wants antibiotic  How long have you been sick? Since last week.   Have you been seen for this problem? yes  If your provider decides to give you a prescription, which pharmacy would you like for it to be sent to? [facility] in [place].   Patient informed that this information will be sent to the clinical staff for review and that they should receive a follow up call.

## 2016-06-14 NOTE — Telephone Encounter (Signed)
Patient called stating that antibiotic was sent to wrong pharmacy and would like for it to be sent to [facility] in [place].  Medication resent

## 2016-06-14 NOTE — Telephone Encounter (Signed)
Pt notified of RX 

## 2016-06-14 NOTE — Addendum Note (Signed)
Addended by: Eustaquio Maize on: 06/14/2016 10:16 AM   Modules accepted: Orders

## 2016-12-09 ENCOUNTER — Other Ambulatory Visit (HOSPITAL_COMMUNITY): Payer: Self-pay | Admitting: Adult Health Nurse Practitioner

## 2016-12-09 DIAGNOSIS — Z780 Asymptomatic menopausal state: Secondary | ICD-10-CM

## 2017-06-17 ENCOUNTER — Other Ambulatory Visit (HOSPITAL_COMMUNITY): Payer: Self-pay | Admitting: Family Medicine

## 2017-06-17 DIAGNOSIS — Z1231 Encounter for screening mammogram for malignant neoplasm of breast: Secondary | ICD-10-CM

## 2017-07-06 ENCOUNTER — Other Ambulatory Visit (HOSPITAL_COMMUNITY): Payer: Self-pay | Admitting: Family Medicine

## 2017-07-06 ENCOUNTER — Encounter (HOSPITAL_COMMUNITY): Payer: Self-pay

## 2017-07-06 ENCOUNTER — Ambulatory Visit (HOSPITAL_COMMUNITY)
Admission: RE | Admit: 2017-07-06 | Discharge: 2017-07-06 | Disposition: A | Discharge status: Home / Self Care | Payer: Medicare HMO | Source: Ambulatory Visit | Attending: Family Medicine | Admitting: Family Medicine

## 2017-07-06 DIAGNOSIS — Z1231 Encounter for screening mammogram for malignant neoplasm of breast: Secondary | ICD-10-CM | POA: Diagnosis present

## 2017-08-08 ENCOUNTER — Ambulatory Visit (HOSPITAL_COMMUNITY)
Admission: RE | Admit: 2017-08-08 | Discharge: 2017-08-08 | Disposition: A | Discharge status: Home / Self Care | Payer: Medicare HMO | Source: Ambulatory Visit | Attending: Adult Health Nurse Practitioner | Admitting: Adult Health Nurse Practitioner

## 2017-08-08 DIAGNOSIS — Z13820 Encounter for screening for osteoporosis: Secondary | ICD-10-CM | POA: Insufficient documentation

## 2017-08-08 DIAGNOSIS — M8589 Other specified disorders of bone density and structure, multiple sites: Secondary | ICD-10-CM | POA: Diagnosis not present

## 2017-08-08 DIAGNOSIS — Z780 Asymptomatic menopausal state: Secondary | ICD-10-CM

## 2017-09-15 ENCOUNTER — Other Ambulatory Visit: Payer: Self-pay | Admitting: Surgery

## 2017-09-15 DIAGNOSIS — I7121 Aneurysm of the ascending aorta, without rupture: Secondary | ICD-10-CM

## 2017-09-15 DIAGNOSIS — I712 Thoracic aortic aneurysm, without rupture: Secondary | ICD-10-CM

## 2017-10-19 ENCOUNTER — Encounter: Payer: Self-pay | Admitting: Surgery

## 2017-10-19 ENCOUNTER — Ambulatory Visit
Admission: RE | Admit: 2017-10-19 | Discharge: 2017-10-19 | Disposition: A | Discharge status: Home / Self Care | Payer: Medicare HMO | Source: Ambulatory Visit | Attending: Surgery | Admitting: Surgery

## 2017-10-19 ENCOUNTER — Ambulatory Visit (INDEPENDENT_AMBULATORY_CARE_PROVIDER_SITE_OTHER): Payer: Medicare HMO | Admitting: Surgery

## 2017-10-19 VITALS — BP 130/71 | HR 88 | Resp 20 | Ht 64.0 in | Wt 176.0 lb

## 2017-10-19 DIAGNOSIS — I712 Thoracic aortic aneurysm, without rupture: Secondary | ICD-10-CM | POA: Diagnosis not present

## 2017-10-19 DIAGNOSIS — I7121 Aneurysm of the ascending aorta, without rupture: Secondary | ICD-10-CM

## 2017-10-19 MED ORDER — GADOBENATE DIMEGLUMINE 529 MG/ML IV SOLN
20.0000 mL | Freq: Once | INTRAVENOUS | Status: AC | PRN
  Administered 2017-10-19: 20 mL via INTRAVENOUS

## 2017-10-19 NOTE — Progress Notes (Signed)
HPI:  The patient returns to my office today for followup of an 4.3 cm ascending aortic aneurysm.  I last saw her on 10/08/2015 at which time it was stable compared to her prior MR angiograms in 2015 and 2012.  She continues to feel well and is going to the gym 3 days/week and working out for about 2 hours a time without any chest pain or shortness of breath. Current Outpatient Medications  Medication Sig Dispense Refill  . Calcium Carbonate-Vit D-Min (CALCIUM 1200 PO) Take by mouth daily.    Marland Kitchen losartan (COZAAR) 100 MG tablet Take 1 tablet (100 mg total) by mouth daily with breakfast. 90 tablet 3  . metoprolol succinate (TOPROL-XL) 25 MG 24 hr tablet TAKE ONE TABLET BY MOUTH EVERY EVENING (MUST  BE  SEEN  BEFORE  NEXT  REFILL) 90 tablet 0  . Multiple Vitamin (MULTIVITAMIN WITH MINERALS) TABS Take 1 tablet by mouth daily.     No current facility-administered medications for this visit.      Physical Exam: BP 130/71   Pulse 88   Resp 20   Ht 5\' 4"  (1.626 m)   Wt 176 lb (79.8 kg)   SpO2 96% Comment: RA  BMI 30.21 kg/m  She looks well. Cardiac exam shows a regular rate and rhythm with normal heart sounds.  There is no murmur. Lungs are clear.  Diagnostic Tests:  CLINICAL DATA:  Ascending aortic aneurysm follow-up, patient has had prior imaging. No new complaints.  EXAM: MRA CHEST WITH  CONTRAST  TECHNIQUE: Angiographic images of the chest were obtained using MRA technique with intravenous contrast.  CONTRAST:  61mL MULTIHANCE GADOBENATE DIMEGLUMINE 529 MG/ML IV SOLN  Creatinine was obtained on site at [facility] at [address]  Results: Creatinine 0.9 mg/dL.  GFR 60.  COMPARISON:  10/08/2015 and previous  FINDINGS: VASCULAR  Aorta: No dissection or stenosis.  Transverse diameters as follows:  3.9 cm sinuses of Valsalva  3.5 cm sino-tubular junction  4.3 cm mid ascending (stable)  3.8 cm distal ascending/proximal arch  3 cm  distal arch/proximal descending  2.7 cm distal descending  Classic 3 vessel brachiocephalic arterial origin anatomy without proximal stenosis. Visualized portions of proximal abdominal aorta unremarkable.  Heart: Normal in size  Pulmonary Arteries: Unremarkable centrally. Limited evaluation of segmental and subsegmental branches.  Other: No hilar or mediastinal adenopathy.  NON-VASCULAR  Spinal cord: Visualized portions unremarkable.  Brachial plexus: Unremarkable  Muscles and tendons: Previous right mastectomy with implant.  Bones: No fracture or worrisome bone lesion.  Joints: Limited evaluation  IMPRESSION: VASCULAR  1. Stable 4.3 cm ascending thoracic aortic aneurysm, without complicating features.  NON-VASCULAR  1. Previous right mastectomy.  No acute findings.   Electronically Signed   By: Lucrezia Europe M.D.   On: 10/19/2017 11:52   Impression:  She has a stable 4.3 cm fusiform ascending aortic aneurysm which is unchanged dating back to 2012.  This is well below the surgical threshold of 5.5 cm.  Since she is 83 years old I think the chance that she is going to have enlargement of this aneurysm to a size that would require surgical therapy would be very low.  I reviewed these MRA images with her and answered her questions.  Since this has been stable since 2012 she does not wish to proceed with further studies in the future.  I discussed the importance of good blood pressure control and preventing further enlargement and aortic dissection.  Her blood pressure is typically been under good control on metoprolol and losartan.  I told her that I would be happy to follow this up with a repeat study if she changes her mind.  Plan:  The patient will return to see me as needed if she decides to have a follow-up MRA in the future to reassess this aneurysm.   I spent 15 minutes performing this established patient evaluation and > 50% of this time was  spent face to face counseling and coordinating the care of this patient's aortic aneurysm.    Gaye Pollack, MD [facility] [phone]

## 2018-07-20 ENCOUNTER — Other Ambulatory Visit (HOSPITAL_COMMUNITY): Payer: Self-pay | Admitting: Family Medicine

## 2018-07-20 DIAGNOSIS — Z1231 Encounter for screening mammogram for malignant neoplasm of breast: Secondary | ICD-10-CM

## 2018-07-31 ENCOUNTER — Encounter (HOSPITAL_COMMUNITY): Payer: Self-pay

## 2018-07-31 ENCOUNTER — Ambulatory Visit (HOSPITAL_COMMUNITY)
Admission: RE | Admit: 2018-07-31 | Discharge: 2018-07-31 | Disposition: A | Discharge status: Home / Self Care | Payer: Medicare Other | Source: Ambulatory Visit | Attending: Family Medicine | Admitting: Family Medicine

## 2018-07-31 ENCOUNTER — Other Ambulatory Visit: Payer: Self-pay

## 2018-07-31 DIAGNOSIS — Z1231 Encounter for screening mammogram for malignant neoplasm of breast: Secondary | ICD-10-CM | POA: Insufficient documentation

## 2019-01-02 DIAGNOSIS — L03031 Cellulitis of right toe: Secondary | ICD-10-CM | POA: Diagnosis not present

## 2019-01-02 DIAGNOSIS — L600 Ingrowing nail: Secondary | ICD-10-CM | POA: Diagnosis not present

## 2019-01-02 DIAGNOSIS — M79671 Pain in right foot: Secondary | ICD-10-CM | POA: Diagnosis not present

## 2019-01-02 DIAGNOSIS — M79674 Pain in right toe(s): Secondary | ICD-10-CM | POA: Diagnosis not present

## 2019-01-23 DIAGNOSIS — L03032 Cellulitis of left toe: Secondary | ICD-10-CM | POA: Diagnosis not present

## 2019-01-23 DIAGNOSIS — L600 Ingrowing nail: Secondary | ICD-10-CM | POA: Diagnosis not present

## 2019-01-23 DIAGNOSIS — M79675 Pain in left toe(s): Secondary | ICD-10-CM | POA: Diagnosis not present

## 2019-01-23 DIAGNOSIS — M79672 Pain in left foot: Secondary | ICD-10-CM | POA: Diagnosis not present

## 2019-02-19 DIAGNOSIS — L570 Actinic keratosis: Secondary | ICD-10-CM | POA: Diagnosis not present

## 2019-02-19 DIAGNOSIS — X32XXXD Exposure to sunlight, subsequent encounter: Secondary | ICD-10-CM | POA: Diagnosis not present

## 2019-04-05 DIAGNOSIS — H905 Unspecified sensorineural hearing loss: Secondary | ICD-10-CM | POA: Diagnosis not present

## 2019-05-17 DIAGNOSIS — Z853 Personal history of malignant neoplasm of breast: Secondary | ICD-10-CM | POA: Diagnosis not present

## 2019-05-17 DIAGNOSIS — E559 Vitamin D deficiency, unspecified: Secondary | ICD-10-CM | POA: Diagnosis not present

## 2019-05-17 DIAGNOSIS — I10 Essential (primary) hypertension: Secondary | ICD-10-CM | POA: Diagnosis not present

## 2019-05-17 DIAGNOSIS — E782 Mixed hyperlipidemia: Secondary | ICD-10-CM | POA: Diagnosis not present

## 2019-06-01 DIAGNOSIS — C50919 Malignant neoplasm of unspecified site of unspecified female breast: Secondary | ICD-10-CM | POA: Diagnosis not present

## 2019-06-01 DIAGNOSIS — Z853 Personal history of malignant neoplasm of breast: Secondary | ICD-10-CM | POA: Diagnosis not present

## 2019-06-18 DIAGNOSIS — Z23 Encounter for immunization: Secondary | ICD-10-CM | POA: Diagnosis not present

## 2019-06-18 DIAGNOSIS — E559 Vitamin D deficiency, unspecified: Secondary | ICD-10-CM | POA: Diagnosis not present

## 2019-06-18 DIAGNOSIS — Z0001 Encounter for general adult medical examination with abnormal findings: Secondary | ICD-10-CM | POA: Diagnosis not present

## 2019-06-18 DIAGNOSIS — E782 Mixed hyperlipidemia: Secondary | ICD-10-CM | POA: Diagnosis not present

## 2019-06-18 DIAGNOSIS — I10 Essential (primary) hypertension: Secondary | ICD-10-CM | POA: Diagnosis not present

## 2019-06-18 DIAGNOSIS — Z853 Personal history of malignant neoplasm of breast: Secondary | ICD-10-CM | POA: Diagnosis not present

## 2019-07-19 ENCOUNTER — Other Ambulatory Visit (HOSPITAL_COMMUNITY): Payer: Self-pay | Admitting: Family Medicine

## 2019-07-19 DIAGNOSIS — Z1231 Encounter for screening mammogram for malignant neoplasm of breast: Secondary | ICD-10-CM

## 2019-07-19 DIAGNOSIS — Z780 Asymptomatic menopausal state: Secondary | ICD-10-CM

## 2019-08-08 ENCOUNTER — Other Ambulatory Visit (HOSPITAL_COMMUNITY): Payer: Medicare Other

## 2019-08-08 ENCOUNTER — Ambulatory Visit (HOSPITAL_COMMUNITY): Payer: Medicare Other

## 2019-08-10 ENCOUNTER — Other Ambulatory Visit (HOSPITAL_COMMUNITY): Payer: Medicare Other

## 2019-08-13 ENCOUNTER — Ambulatory Visit (HOSPITAL_COMMUNITY): Payer: Medicare Other

## 2019-08-13 ENCOUNTER — Ambulatory Visit (HOSPITAL_COMMUNITY)
Admission: RE | Admit: 2019-08-13 | Discharge: 2019-08-13 | Disposition: A | Discharge status: Home / Self Care | Payer: Medicare Other | Source: Ambulatory Visit | Attending: Family Medicine | Admitting: Family Medicine

## 2019-08-13 ENCOUNTER — Other Ambulatory Visit: Payer: Self-pay

## 2019-08-13 DIAGNOSIS — Z1231 Encounter for screening mammogram for malignant neoplasm of breast: Secondary | ICD-10-CM | POA: Diagnosis not present

## 2019-08-13 DIAGNOSIS — M8589 Other specified disorders of bone density and structure, multiple sites: Secondary | ICD-10-CM | POA: Diagnosis not present

## 2019-08-13 DIAGNOSIS — Z780 Asymptomatic menopausal state: Secondary | ICD-10-CM

## 2019-08-22 DIAGNOSIS — Z23 Encounter for immunization: Secondary | ICD-10-CM | POA: Diagnosis not present

## 2019-10-16 DIAGNOSIS — S63501A Unspecified sprain of right wrist, initial encounter: Secondary | ICD-10-CM | POA: Diagnosis not present

## 2019-10-18 DIAGNOSIS — H40053 Ocular hypertension, bilateral: Secondary | ICD-10-CM | POA: Diagnosis not present

## 2019-10-18 DIAGNOSIS — Z961 Presence of intraocular lens: Secondary | ICD-10-CM | POA: Diagnosis not present

## 2019-10-18 DIAGNOSIS — H40023 Open angle with borderline findings, high risk, bilateral: Secondary | ICD-10-CM | POA: Diagnosis not present

## 2019-12-13 DIAGNOSIS — Z13220 Encounter for screening for lipoid disorders: Secondary | ICD-10-CM | POA: Diagnosis not present

## 2019-12-13 DIAGNOSIS — I10 Essential (primary) hypertension: Secondary | ICD-10-CM | POA: Diagnosis not present

## 2019-12-13 DIAGNOSIS — E782 Mixed hyperlipidemia: Secondary | ICD-10-CM | POA: Diagnosis not present

## 2020-01-14 DIAGNOSIS — I10 Essential (primary) hypertension: Secondary | ICD-10-CM | POA: Diagnosis not present

## 2020-01-14 DIAGNOSIS — Z853 Personal history of malignant neoplasm of breast: Secondary | ICD-10-CM | POA: Diagnosis not present

## 2020-01-14 DIAGNOSIS — S63501A Unspecified sprain of right wrist, initial encounter: Secondary | ICD-10-CM | POA: Diagnosis not present

## 2020-01-14 DIAGNOSIS — Z23 Encounter for immunization: Secondary | ICD-10-CM | POA: Diagnosis not present

## 2020-01-14 DIAGNOSIS — E782 Mixed hyperlipidemia: Secondary | ICD-10-CM | POA: Diagnosis not present

## 2020-01-14 DIAGNOSIS — Z9011 Acquired absence of right breast and nipple: Secondary | ICD-10-CM | POA: Diagnosis not present

## 2020-01-14 DIAGNOSIS — M654 Radial styloid tenosynovitis [de Quervain]: Secondary | ICD-10-CM | POA: Diagnosis not present

## 2020-02-07 DIAGNOSIS — H905 Unspecified sensorineural hearing loss: Secondary | ICD-10-CM | POA: Diagnosis not present

## 2020-02-14 ENCOUNTER — Ambulatory Visit (INDEPENDENT_AMBULATORY_CARE_PROVIDER_SITE_OTHER): Payer: Medicare Other | Admitting: Orthopaedic Surgery

## 2020-02-14 ENCOUNTER — Other Ambulatory Visit: Payer: Self-pay

## 2020-02-14 ENCOUNTER — Encounter: Payer: Self-pay | Admitting: Orthopaedic Surgery

## 2020-02-14 VITALS — Ht 64.0 in | Wt 180.0 lb

## 2020-02-14 DIAGNOSIS — M654 Radial styloid tenosynovitis [de Quervain]: Secondary | ICD-10-CM | POA: Insufficient documentation

## 2020-02-14 MED ORDER — LIDOCAINE HCL (PF) 1 % IJ SOLN
1.0000 mL | INTRAMUSCULAR | Status: AC | PRN
  Administered 2020-02-14: 1 mL

## 2020-02-14 MED ORDER — METHYLPREDNISOLONE ACETATE 40 MG/ML IJ SUSP
40.0000 mg | INTRAMUSCULAR | Status: AC | PRN
  Administered 2020-02-14: 40 mg

## 2020-02-14 MED ORDER — LIDOCAINE HCL 1 % IJ SOLN
0.3000 mL | INTRAMUSCULAR | Status: AC | PRN
  Administered 2020-02-14: .3 mL

## 2020-02-14 NOTE — Progress Notes (Signed)
Office Visit Note   Patient: Kelli Rodriguez           Date of Birth: 1934/06/14           MRN: [id number] Visit Date: 02/14/2020              Requested by: Dione Housekeeper, MD [address] [place],  [place] [address] PCP: Curlene Labrum, MD   Assessment & Plan: Visit Diagnoses:  1. Tendinitis, de Quervain's     Plan: Right wrist first dorsal compartment injection performed with good relief of preinjection pain. Radial gutter splint applied she can use intermittently. She has persistent symptoms she will return. Radiographs reviewed with patient. Pathophysiology of first dorsal compartment tenosynovitis discussed. Follow-Up Instructions: No follow-ups on file.   Orders:  Orders Placed This Encounter  Procedures  . Hand/UE Inj   No orders of the defined types were placed in this encounter.     Procedures: Hand/UE Inj for de Quervain's tenosynovitis on 02/14/2020 10:36 AM Medications: 0.3 mL lidocaine 1 %; 1 mL lidocaine (PF) 1 %; 40 mg methylPREDNISolone acetate 40 MG/ML      Clinical Data: No additional findings.   Subjective: Chief Complaint  Patient presents with  . Right Wrist - Pain    HPI 86 year old widow here with right wrist pain. She fell 2 months ago and then noticed a ganglion popped up next to the flexor carpi radialis tendon. It has not enlarged has not really been painful but she has had pain across her wrist and pain with gripping squeezing and points over the radial side of her wrist. She denies associated neck pain. She is use some Voltaren gel which helps somewhat. Previous breast cancer lymph node dissection on the right side. She has noticed possibly some engorgement of the dorsal veins on her wrist. No numbness or tingling in her fingers. Patient is right-hand dominant. Patient had previous wrist x-rays that showed osteoporotic bone negative for acute fracture or significant degenerative changes. Patient had total knee arthroplasties done  right and left knee by Dr. Marlou Sa which are doing well.  Review of Systems positive breast cancer hyperlipidemia hypertension. All other systems are negative to HPI.   Objective: Vital Signs: Ht 5\' 4"  (1.626 m)   Wt 180 lb (81.6 kg)   BMI 30.90 kg/m   Physical Exam Constitutional:      Appearance: She is well-developed.  HENT:     Head: Normocephalic.     Right Ear: External ear normal.     Left Ear: External ear normal.  Eyes:     Pupils: Pupils are equal, round, and reactive to light.  Neck:     Thyroid: No thyromegaly.     Trachea: No tracheal deviation.  Cardiovascular:     Rate and Rhythm: Normal rate.  Pulmonary:     Effort: Pulmonary effort is normal.  Abdominal:     Palpations: Abdomen is soft.  Skin:    General: Skin is warm and dry.  Neurological:     Mental Status: She is alert and oriented to person, place, and time.  Psychiatric:        Mood and Affect: Mood and affect normal.        Behavior: Behavior normal.     Ortho Exam patient has positive Finkelstein test on the right negative on the left. 3 mm to 4 mm ganglion adjacent flexor carpi radialis volarly on the wrist joint which is minimally tender. Significant tenderness to palpation of the  first dorsal compartment.  Specialty Comments:  No specialty comments available.  Imaging: No results found.   PMFS History: Patient Active Problem List   Diagnosis Date Noted  . Tendinitis, de Quervain's 02/14/2020  . Insomnia 12/06/2014  . Vitamin B 12 deficiency 12/06/2014  . Ascending aortic aneurysm ([place]) 08/24/2011  . BREAST CANCER 07/09/2009  . Hyperlipidemia 07/09/2009  . BRONCHIECTASIS 07/09/2009  . Hemoptysis 07/09/2009  . Essential hypertension 07/08/2009   Past Medical History:  Diagnosis Date  . Anemia   . Aneurysm ([place]) 2014  . Aneurysm of aorta ([place]) 2014  . Aneurysm, thoracic   . Arthritis    knees and hands  . Ascending aortic aneurysm ([place])    followed by Dr. Cyndia Bent, 4.3 cm  08/2011 by MRA  . Blood transfusion without reported diagnosis   . Bronchitis, chronic ([place])   . Cancer [facility])    right breast cancer s/p mastectomy '00  . Chronic kidney disease   . Constipation due to pain medication   . COPD (chronic obstructive pulmonary disease) ([place])    uses o2 @ night  . Hypertension     Family History  Problem Relation Age of Onset  . Cancer Neg Hx   . Heart disease Neg Hx     Past Surgical History:  Procedure Laterality Date  . ABDOMINAL HYSTERECTOMY    . APPENDECTOMY    . BLADDER SURGERY     tacked  . BREAST SURGERY    . BROW LIFT Bilateral 10/31/2014   Procedure: BLEPHAROPLASTY;  Surgeon: Baruch Goldmann, MD;  Location: [facility];  Service: Ophthalmology;  Laterality: Bilateral;  . EYE SURGERY     bilateral--lens implant left  . JOINT REPLACEMENT    . KNEE ARTHROSCOPY     right  . MASTECTOMY     right    2002  . right breast implant    . TOTAL KNEE ARTHROPLASTY  02/01/2012   RIGHT KNEE  . TOTAL KNEE ARTHROPLASTY  02/01/2012   Procedure: TOTAL KNEE ARTHROPLASTY;  Surgeon: Meredith Pel, MD;  Location: [facility];  Service: Orthopedics;  Laterality: Right;  Right total knee arthroplasty  . TOTAL KNEE ARTHROPLASTY Left 08/22/2012   Procedure: TOTAL KNEE ARTHROPLASTY- left;  Surgeon: Meredith Pel, MD;  Location: [facility];  Service: Orthopedics;  Laterality: Left;  . TUBAL LIGATION     1968   Social History   Occupational History  . Not on file  Tobacco Use  . Smoking status: Never Smoker  . Smokeless tobacco: Never Used  Substance and Sexual Activity  . Alcohol use: No  . Drug use: No  . Sexual activity: Not on file

## 2020-04-14 DIAGNOSIS — E782 Mixed hyperlipidemia: Secondary | ICD-10-CM | POA: Diagnosis not present

## 2020-04-14 DIAGNOSIS — I10 Essential (primary) hypertension: Secondary | ICD-10-CM | POA: Diagnosis not present

## 2020-04-14 DIAGNOSIS — Z13220 Encounter for screening for lipoid disorders: Secondary | ICD-10-CM | POA: Diagnosis not present

## 2020-04-14 DIAGNOSIS — E7849 Other hyperlipidemia: Secondary | ICD-10-CM | POA: Diagnosis not present

## 2020-04-17 DIAGNOSIS — Z853 Personal history of malignant neoplasm of breast: Secondary | ICD-10-CM | POA: Diagnosis not present

## 2020-04-17 DIAGNOSIS — Z9011 Acquired absence of right breast and nipple: Secondary | ICD-10-CM | POA: Diagnosis not present

## 2020-04-17 DIAGNOSIS — E7849 Other hyperlipidemia: Secondary | ICD-10-CM | POA: Diagnosis not present

## 2020-04-17 DIAGNOSIS — H6123 Impacted cerumen, bilateral: Secondary | ICD-10-CM | POA: Diagnosis not present

## 2020-04-17 DIAGNOSIS — R944 Abnormal results of kidney function studies: Secondary | ICD-10-CM | POA: Diagnosis not present

## 2020-04-17 DIAGNOSIS — I10 Essential (primary) hypertension: Secondary | ICD-10-CM | POA: Diagnosis not present

## 2020-05-02 DIAGNOSIS — N3020 Other chronic cystitis without hematuria: Secondary | ICD-10-CM | POA: Diagnosis not present

## 2020-05-02 DIAGNOSIS — R319 Hematuria, unspecified: Secondary | ICD-10-CM | POA: Diagnosis not present

## 2020-06-12 ENCOUNTER — Ambulatory Visit (INDEPENDENT_AMBULATORY_CARE_PROVIDER_SITE_OTHER): Payer: Medicare Other | Admitting: Orthopaedic Surgery

## 2020-06-12 ENCOUNTER — Ambulatory Visit: Payer: Self-pay

## 2020-06-12 DIAGNOSIS — M654 Radial styloid tenosynovitis [de Quervain]: Secondary | ICD-10-CM | POA: Diagnosis not present

## 2020-06-12 NOTE — Progress Notes (Signed)
Office Visit Note   Patient: Kelli Rodriguez           Date of Birth: 21-Apr-1934           MRN: [id number] Visit Date: 06/12/2020              Requested by: Curlene Labrum, MD [place],  [place] [id number] PCP: Curlene Labrum, MD   Assessment & Plan: Visit Diagnoses:  1. Tendinitis, de Quervain's           Right wrist  Plan: Patient like to proceed with outpatient release.  We discussed the either brief general anesthesia or possible supraclavicular block.  Procedure discussed questions were answered she requests we proceed.  Follow-Up Instructions: No follow-ups on file.   Orders:  Orders Placed This Encounter  Procedures  . XR Wrist 2 Views Right   No orders of the defined types were placed in this encounter.     Procedures: No procedures performed   Clinical Data: No additional findings.   Subjective: Chief Complaint  Patient presents with  . Right Wrist - Follow-up, Pain    HPI 86 year old female returns with recurrent right first dorsal compartment tenosynovitis.  Previous injection 02/14/2020 + splint.  She got 3 to 4 weeks of total relief then gradual recurrence of symptoms of what she is having to wear splint all the time.  She lives alone when she does standing cooking activities pain is significantly worse sometimes when she moves wrist she will feel sharp pain that shoots up to her elbow.  Patient states "I need to get something done about this".  She states she really does not want to have the injection again since it only lasted for about 3 to 4 weeks.  Review of Systems Past history of thoracic aneurysm is followed by Dr. Caffie Pinto stable for many years never changed and he released her from care and follow-up.  She does have hypertension on Toprol and Lopressor.  Past history of breast cancer with removal of lymph nodes on the right side which was 20+ years ago with no recurrence.  All the systems noncontributory to HPI.  Objective: Vital  Signs: There were no vitals taken for this visit.  Physical Exam Constitutional:      Appearance: She is well-developed.  HENT:     Head: Normocephalic.     Right Ear: External ear normal.     Left Ear: External ear normal.  Eyes:     Pupils: Pupils are equal, round, and reactive to light.  Neck:     Thyroid: No thyromegaly.     Trachea: No tracheal deviation.  Cardiovascular:     Rate and Rhythm: Normal rate.  Pulmonary:     Effort: Pulmonary effort is normal.  Abdominal:     Palpations: Abdomen is soft.  Skin:    General: Skin is warm and dry.  Neurological:     Mental Status: She is alert and oriented to person, place, and time.  Psychiatric:        Behavior: Behavior normal.     Ortho Exam patient has positive Finkelstein test on the right exquisite tenderness over the first dorsal compartment.  Small ganglion adjacent to the FCR volar side nontender.  Carpal tunnel exam is negative.  Opposite left hand shows normal dorsal compartments.  Specialty Comments:  No specialty comments available.  Imaging: No results found.   PMFS History: Patient Active Problem List   Diagnosis Date Noted  .  Tendinitis, de Quervain's 02/14/2020  . Insomnia 12/06/2014  . Vitamin B 12 deficiency 12/06/2014  . Ascending aortic aneurysm ([place]) 08/24/2011  . BREAST CANCER 07/09/2009  . Hyperlipidemia 07/09/2009  . BRONCHIECTASIS 07/09/2009  . Hemoptysis 07/09/2009  . Essential hypertension 07/08/2009   Past Medical History:  Diagnosis Date  . Anemia   . Aneurysm ([place]) 2014  . Aneurysm of aorta ([place]) 2014  . Aneurysm, thoracic   . Arthritis    knees and hands  . Ascending aortic aneurysm ([place])    followed by Dr. Cyndia Bent, 4.3 cm 08/2011 by MRA  . Blood transfusion without reported diagnosis   . Bronchitis, chronic ([place])   . Cancer [facility])    right breast cancer s/p mastectomy '00  . Chronic kidney disease   . Constipation due to pain medication   . COPD (chronic obstructive  pulmonary disease) ([place])    uses o2 @ night  . Hypertension     Family History  Problem Relation Age of Onset  . Cancer Neg Hx   . Heart disease Neg Hx     Past Surgical History:  Procedure Laterality Date  . ABDOMINAL HYSTERECTOMY    . APPENDECTOMY    . BLADDER SURGERY     tacked  . BREAST SURGERY    . BROW LIFT Bilateral 10/31/2014   Procedure: BLEPHAROPLASTY;  Surgeon: Baruch Goldmann, MD;  Location: [facility];  Service: Ophthalmology;  Laterality: Bilateral;  . EYE SURGERY     bilateral--lens implant left  . JOINT REPLACEMENT    . KNEE ARTHROSCOPY     right  . MASTECTOMY     right    2002  . right breast implant    . TOTAL KNEE ARTHROPLASTY  02/01/2012   RIGHT KNEE  . TOTAL KNEE ARTHROPLASTY  02/01/2012   Procedure: TOTAL KNEE ARTHROPLASTY;  Surgeon: Meredith Pel, MD;  Location: [facility];  Service: Orthopedics;  Laterality: Right;  Right total knee arthroplasty  . TOTAL KNEE ARTHROPLASTY Left 08/22/2012   Procedure: TOTAL KNEE ARTHROPLASTY- left;  Surgeon: Meredith Pel, MD;  Location: [facility];  Service: Orthopedics;  Laterality: Left;  . TUBAL LIGATION     1968   Social History   Occupational History  . Not on file  Tobacco Use  . Smoking status: Never Smoker  . Smokeless tobacco: Never Used  Substance and Sexual Activity  . Alcohol use: No  . Drug use: No  . Sexual activity: Not on file

## 2020-07-03 DIAGNOSIS — L708 Other acne: Secondary | ICD-10-CM | POA: Diagnosis not present

## 2020-07-03 DIAGNOSIS — L308 Other specified dermatitis: Secondary | ICD-10-CM | POA: Diagnosis not present

## 2020-07-03 DIAGNOSIS — D225 Melanocytic nevi of trunk: Secondary | ICD-10-CM | POA: Diagnosis not present

## 2020-07-07 ENCOUNTER — Other Ambulatory Visit: Payer: Self-pay | Admitting: Orthopaedic Surgery

## 2020-07-07 ENCOUNTER — Other Ambulatory Visit: Payer: Self-pay | Admitting: Surgical

## 2020-07-07 ENCOUNTER — Telehealth: Payer: Self-pay | Admitting: Radiology

## 2020-07-07 DIAGNOSIS — M654 Radial styloid tenosynovitis [de Quervain]: Secondary | ICD-10-CM | POA: Diagnosis not present

## 2020-07-07 MED ORDER — HYDROCODONE-ACETAMINOPHEN 5-325 MG PO TABS: 1.0000 | ORAL_TABLET | Freq: Four times a day (QID) | ORAL | 0 refills | Status: DC | PRN

## 2020-07-07 NOTE — Telephone Encounter (Signed)
I called [facility] and cancelled rx.

## 2020-07-07 NOTE — Progress Notes (Signed)
norco sent in for post op pain

## 2020-07-07 NOTE — Telephone Encounter (Signed)
Sent in

## 2020-07-07 NOTE — Telephone Encounter (Signed)
Received call from [facility] stating patient has been expecting pain medication all day and they have not received anything. Medication was sent to [facility] in [place] by Dr. Lorin Mercy. I called Dr. Lorin Mercy who states that he is not near a computer, but would be grateful if Lurena Joiner could send it to [facility] and I would cancel at [facility].  Could you please just reorder at [facility]? It is the hydrocodone 5/325. Thanks.

## 2020-07-17 ENCOUNTER — Encounter: Payer: Self-pay | Admitting: Orthopaedic Surgery

## 2020-07-17 ENCOUNTER — Ambulatory Visit (INDEPENDENT_AMBULATORY_CARE_PROVIDER_SITE_OTHER): Payer: Medicare Other | Admitting: Orthopaedic Surgery

## 2020-07-17 ENCOUNTER — Other Ambulatory Visit: Payer: Self-pay

## 2020-07-17 VITALS — Ht 64.0 in | Wt 180.0 lb

## 2020-07-17 DIAGNOSIS — M654 Radial styloid tenosynovitis [de Quervain]: Secondary | ICD-10-CM

## 2020-07-17 NOTE — Progress Notes (Signed)
Post-Op Visit Note   Patient: Kelli Rodriguez           Date of Birth: 30-May-1934           MRN: [id number] Visit Date: 07/17/2020 PCP: Curlene Labrum, MD   Assessment & Plan: Postop right radial styloid tenosynovitis release.  Single nylon suture removed Steri-Strips applied.  She has a splint at home she can use for a week or 2 protected.  Return as needed.  She is happy with the surgical result and good pain relief.  Chief Complaint:  Chief Complaint  Patient presents with   Right Wrist - Routine Post Op    07/07/2020 Right 1st dorsal compartment release   Visit Diagnoses:  1. Tendinitis, de Quervain's     Plan: Return as needed  Follow-Up Instructions: No follow-ups on file.   Orders:  No orders of the defined types were placed in this encounter.  No orders of the defined types were placed in this encounter.   Imaging: No results found.  PMFS History: Patient Active Problem List   Diagnosis Date Noted   Tendinitis, de Quervain's 02/14/2020   Insomnia 12/06/2014   Vitamin B 12 deficiency 12/06/2014   Ascending aortic aneurysm ([place]) 08/24/2011   BREAST CANCER 07/09/2009   Hyperlipidemia 07/09/2009   BRONCHIECTASIS 07/09/2009   Hemoptysis 07/09/2009   Essential hypertension 07/08/2009   Past Medical History:  Diagnosis Date   Anemia    Aneurysm ([place]) 2014   Aneurysm of aorta ([place]) 2014   Aneurysm, thoracic    Arthritis    knees and hands   Ascending aortic aneurysm ([place])    followed by Dr. Cyndia Bent, 4.3 cm 08/2011 by MRA   Blood transfusion without reported diagnosis    Bronchitis, chronic ([place])    Cancer ([place])    right breast cancer s/p mastectomy '00   Chronic kidney disease    Constipation due to pain medication    COPD (chronic obstructive pulmonary disease) ([place])    uses o2 @ night   Hypertension     Family History  Problem Relation Age of Onset   Cancer Neg Hx    Heart disease Neg Hx     Past Surgical History:  Procedure Laterality Date    ABDOMINAL HYSTERECTOMY     APPENDECTOMY     BLADDER SURGERY     tacked   BREAST SURGERY     BROW LIFT Bilateral 10/31/2014   Procedure: BLEPHAROPLASTY;  Surgeon: Baruch Goldmann, MD;  Location: [facility];  Service: Ophthalmology;  Laterality: Bilateral;   EYE SURGERY     bilateral--lens implant left   JOINT REPLACEMENT     KNEE ARTHROSCOPY     right   MASTECTOMY     right    2002   right breast implant     TOTAL KNEE ARTHROPLASTY  02/01/2012   RIGHT KNEE   TOTAL KNEE ARTHROPLASTY  02/01/2012   Procedure: TOTAL KNEE ARTHROPLASTY;  Surgeon: Meredith Pel, MD;  Location: [facility];  Service: Orthopedics;  Laterality: Right;  Right total knee arthroplasty   TOTAL KNEE ARTHROPLASTY Left 08/22/2012   Procedure: TOTAL KNEE ARTHROPLASTY- left;  Surgeon: Meredith Pel, MD;  Location: [facility];  Service: Orthopedics;  Laterality: Left;   TUBAL LIGATION     1968   Social History   Occupational History   Not on file  Tobacco Use   Smoking status: Never   Smokeless tobacco: Never  Substance and Sexual Activity  Alcohol use: No   Drug use: No   Sexual activity: Not on file

## 2020-07-18 ENCOUNTER — Other Ambulatory Visit (HOSPITAL_COMMUNITY): Payer: Self-pay | Admitting: Family Medicine

## 2020-07-18 DIAGNOSIS — Z1231 Encounter for screening mammogram for malignant neoplasm of breast: Secondary | ICD-10-CM

## 2020-08-25 ENCOUNTER — Ambulatory Visit (HOSPITAL_COMMUNITY): Payer: Medicare Other

## 2020-09-03 ENCOUNTER — Other Ambulatory Visit: Payer: Self-pay

## 2020-09-03 ENCOUNTER — Ambulatory Visit (HOSPITAL_COMMUNITY)
Admission: RE | Admit: 2020-09-03 | Discharge: 2020-09-03 | Disposition: A | Discharge status: Home / Self Care | Payer: Medicare Other | Source: Ambulatory Visit | Attending: Family Medicine | Admitting: Family Medicine

## 2020-09-03 DIAGNOSIS — Z1231 Encounter for screening mammogram for malignant neoplasm of breast: Secondary | ICD-10-CM | POA: Diagnosis not present

## 2020-10-31 DIAGNOSIS — E782 Mixed hyperlipidemia: Secondary | ICD-10-CM | POA: Diagnosis not present

## 2020-10-31 DIAGNOSIS — E7849 Other hyperlipidemia: Secondary | ICD-10-CM | POA: Diagnosis not present

## 2020-10-31 DIAGNOSIS — Z1329 Encounter for screening for other suspected endocrine disorder: Secondary | ICD-10-CM | POA: Diagnosis not present

## 2020-10-31 DIAGNOSIS — I10 Essential (primary) hypertension: Secondary | ICD-10-CM | POA: Diagnosis not present

## 2020-10-31 DIAGNOSIS — Z20822 Contact with and (suspected) exposure to covid-19: Secondary | ICD-10-CM | POA: Diagnosis not present

## 2020-10-31 DIAGNOSIS — E559 Vitamin D deficiency, unspecified: Secondary | ICD-10-CM | POA: Diagnosis not present

## 2020-11-13 DIAGNOSIS — M8580 Other specified disorders of bone density and structure, unspecified site: Secondary | ICD-10-CM | POA: Diagnosis not present

## 2020-11-13 DIAGNOSIS — I10 Essential (primary) hypertension: Secondary | ICD-10-CM | POA: Diagnosis not present

## 2020-11-13 DIAGNOSIS — Z9011 Acquired absence of right breast and nipple: Secondary | ICD-10-CM | POA: Diagnosis not present

## 2020-11-13 DIAGNOSIS — E7849 Other hyperlipidemia: Secondary | ICD-10-CM | POA: Diagnosis not present

## 2020-11-13 DIAGNOSIS — Z0001 Encounter for general adult medical examination with abnormal findings: Secondary | ICD-10-CM | POA: Diagnosis not present

## 2020-11-13 DIAGNOSIS — R944 Abnormal results of kidney function studies: Secondary | ICD-10-CM | POA: Diagnosis not present

## 2020-11-13 DIAGNOSIS — Z853 Personal history of malignant neoplasm of breast: Secondary | ICD-10-CM | POA: Diagnosis not present

## 2020-11-13 DIAGNOSIS — H9190 Unspecified hearing loss, unspecified ear: Secondary | ICD-10-CM | POA: Diagnosis not present

## 2021-01-15 DIAGNOSIS — W108XXA Fall (on) (from) other stairs and steps, initial encounter: Secondary | ICD-10-CM | POA: Diagnosis not present

## 2021-01-15 DIAGNOSIS — M25521 Pain in right elbow: Secondary | ICD-10-CM | POA: Diagnosis not present

## 2021-01-15 DIAGNOSIS — M25531 Pain in right wrist: Secondary | ICD-10-CM | POA: Diagnosis not present

## 2021-01-15 DIAGNOSIS — M79631 Pain in right forearm: Secondary | ICD-10-CM | POA: Diagnosis not present

## 2021-02-11 DIAGNOSIS — H905 Unspecified sensorineural hearing loss: Secondary | ICD-10-CM | POA: Diagnosis not present

## 2021-05-11 DIAGNOSIS — E782 Mixed hyperlipidemia: Secondary | ICD-10-CM | POA: Diagnosis not present

## 2021-05-11 DIAGNOSIS — E7849 Other hyperlipidemia: Secondary | ICD-10-CM | POA: Diagnosis not present

## 2021-05-11 DIAGNOSIS — I10 Essential (primary) hypertension: Secondary | ICD-10-CM | POA: Diagnosis not present

## 2021-05-14 DIAGNOSIS — R944 Abnormal results of kidney function studies: Secondary | ICD-10-CM | POA: Diagnosis not present

## 2021-05-14 DIAGNOSIS — Z0001 Encounter for general adult medical examination with abnormal findings: Secondary | ICD-10-CM | POA: Diagnosis not present

## 2021-05-14 DIAGNOSIS — I10 Essential (primary) hypertension: Secondary | ICD-10-CM | POA: Diagnosis not present

## 2021-05-14 DIAGNOSIS — Z1389 Encounter for screening for other disorder: Secondary | ICD-10-CM | POA: Diagnosis not present

## 2021-05-14 DIAGNOSIS — E7849 Other hyperlipidemia: Secondary | ICD-10-CM | POA: Diagnosis not present

## 2021-06-01 DIAGNOSIS — C50919 Malignant neoplasm of unspecified site of unspecified female breast: Secondary | ICD-10-CM | POA: Diagnosis not present

## 2021-06-01 DIAGNOSIS — Z9011 Acquired absence of right breast and nipple: Secondary | ICD-10-CM | POA: Diagnosis not present

## 2021-06-08 ENCOUNTER — Emergency Department (HOSPITAL_COMMUNITY): Payer: Medicare Other

## 2021-06-08 ENCOUNTER — Inpatient Hospital Stay (HOSPITAL_COMMUNITY)
Admission: EM | Admit: 2021-06-08 | Discharge: 2021-06-10 | DRG: 871 | Disposition: A | Discharge status: Home / Self Care | Payer: Medicare Other | Attending: Internal Medicine | Admitting: Internal Medicine

## 2021-06-08 ENCOUNTER — Encounter (HOSPITAL_COMMUNITY): Payer: Self-pay | Admitting: Emergency Medicine

## 2021-06-08 ENCOUNTER — Other Ambulatory Visit: Payer: Self-pay

## 2021-06-08 DIAGNOSIS — E876 Hypokalemia: Secondary | ICD-10-CM | POA: Diagnosis present

## 2021-06-08 DIAGNOSIS — D72829 Elevated white blood cell count, unspecified: Secondary | ICD-10-CM | POA: Diagnosis not present

## 2021-06-08 DIAGNOSIS — I7121 Aneurysm of the ascending aorta, without rupture: Secondary | ICD-10-CM | POA: Diagnosis present

## 2021-06-08 DIAGNOSIS — Z9011 Acquired absence of right breast and nipple: Secondary | ICD-10-CM

## 2021-06-08 DIAGNOSIS — R6520 Severe sepsis without septic shock: Secondary | ICD-10-CM | POA: Diagnosis not present

## 2021-06-08 DIAGNOSIS — I160 Hypertensive urgency: Secondary | ICD-10-CM | POA: Diagnosis not present

## 2021-06-08 DIAGNOSIS — N3289 Other specified disorders of bladder: Secondary | ICD-10-CM | POA: Diagnosis not present

## 2021-06-08 DIAGNOSIS — J9601 Acute respiratory failure with hypoxia: Secondary | ICD-10-CM | POA: Diagnosis not present

## 2021-06-08 DIAGNOSIS — R0600 Dyspnea, unspecified: Secondary | ICD-10-CM | POA: Diagnosis not present

## 2021-06-08 DIAGNOSIS — E669 Obesity, unspecified: Secondary | ICD-10-CM | POA: Diagnosis present

## 2021-06-08 DIAGNOSIS — I129 Hypertensive chronic kidney disease with stage 1 through stage 4 chronic kidney disease, or unspecified chronic kidney disease: Secondary | ICD-10-CM | POA: Diagnosis not present

## 2021-06-08 DIAGNOSIS — E871 Hypo-osmolality and hyponatremia: Secondary | ICD-10-CM | POA: Diagnosis present

## 2021-06-08 DIAGNOSIS — A419 Sepsis, unspecified organism: Principal | ICD-10-CM | POA: Diagnosis present

## 2021-06-08 DIAGNOSIS — R000 Tachycardia, unspecified: Secondary | ICD-10-CM | POA: Diagnosis not present

## 2021-06-08 DIAGNOSIS — E559 Vitamin D deficiency, unspecified: Secondary | ICD-10-CM | POA: Diagnosis present

## 2021-06-08 DIAGNOSIS — D631 Anemia in chronic kidney disease: Secondary | ICD-10-CM | POA: Diagnosis present

## 2021-06-08 DIAGNOSIS — N182 Chronic kidney disease, stage 2 (mild): Secondary | ICD-10-CM | POA: Diagnosis present

## 2021-06-08 DIAGNOSIS — R739 Hyperglycemia, unspecified: Secondary | ICD-10-CM | POA: Diagnosis present

## 2021-06-08 DIAGNOSIS — R109 Unspecified abdominal pain: Secondary | ICD-10-CM | POA: Diagnosis present

## 2021-06-08 DIAGNOSIS — Z96653 Presence of artificial knee joint, bilateral: Secondary | ICD-10-CM | POA: Diagnosis present

## 2021-06-08 DIAGNOSIS — Z90710 Acquired absence of both cervix and uterus: Secondary | ICD-10-CM | POA: Diagnosis not present

## 2021-06-08 DIAGNOSIS — N10 Acute tubulo-interstitial nephritis: Secondary | ICD-10-CM | POA: Diagnosis present

## 2021-06-08 DIAGNOSIS — I700 Atherosclerosis of aorta: Secondary | ICD-10-CM | POA: Diagnosis not present

## 2021-06-08 DIAGNOSIS — I10 Essential (primary) hypertension: Secondary | ICD-10-CM | POA: Diagnosis not present

## 2021-06-08 DIAGNOSIS — N12 Tubulo-interstitial nephritis, not specified as acute or chronic: Principal | ICD-10-CM

## 2021-06-08 DIAGNOSIS — J449 Chronic obstructive pulmonary disease, unspecified: Secondary | ICD-10-CM | POA: Diagnosis not present

## 2021-06-08 DIAGNOSIS — N2889 Other specified disorders of kidney and ureter: Secondary | ICD-10-CM | POA: Diagnosis not present

## 2021-06-08 DIAGNOSIS — Z6830 Body mass index (BMI) 30.0-30.9, adult: Secondary | ICD-10-CM | POA: Diagnosis not present

## 2021-06-08 DIAGNOSIS — K5730 Diverticulosis of large intestine without perforation or abscess without bleeding: Secondary | ICD-10-CM | POA: Diagnosis not present

## 2021-06-08 DIAGNOSIS — E782 Mixed hyperlipidemia: Secondary | ICD-10-CM | POA: Diagnosis present

## 2021-06-08 DIAGNOSIS — Z79899 Other long term (current) drug therapy: Secondary | ICD-10-CM

## 2021-06-08 DIAGNOSIS — E860 Dehydration: Secondary | ICD-10-CM | POA: Diagnosis present

## 2021-06-08 DIAGNOSIS — Z853 Personal history of malignant neoplasm of breast: Secondary | ICD-10-CM | POA: Diagnosis not present

## 2021-06-08 LAB — URINALYSIS, ROUTINE W REFLEX MICROSCOPIC
Bilirubin Urine: NEGATIVE
Glucose, UA: NEGATIVE mg/dL
Ketones, ur: NEGATIVE mg/dL
Nitrite: POSITIVE — AB
Protein, ur: 100 mg/dL — AB
RBC / HPF: >50 RBC/hpf — ABNORMAL HIGH (ref 0–5)
Specific Gravity, Urine: 1.021 (ref 1.005–1.030)
WBC, UA: >50 WBC/hpf — ABNORMAL HIGH (ref 0–5)
pH: 6 (ref 5.0–8.0)

## 2021-06-08 LAB — COMPREHENSIVE METABOLIC PANEL
ALT: 24 U/L (ref 0–44)
AST: 22 U/L (ref 15–41)
Albumin: 4.1 g/dL (ref 3.5–5.0)
Alkaline Phosphatase: 70 U/L (ref 38–126)
Anion gap: 11 (ref 5–15)
BUN: 15 mg/dL (ref 8–23)
CO2: 24 mmol/L (ref 22–32)
Calcium: 9 mg/dL (ref 8.9–10.3)
Chloride: 97 mmol/L — ABNORMAL LOW (ref 98–111)
Creatinine, Ser: 0.82 mg/dL (ref 0.44–1.00)
GFR, Estimated: >60 mL/min (ref >60)
Glucose, Bld: 142 mg/dL — ABNORMAL HIGH (ref 70–99)
Potassium: 3.3 mmol/L — ABNORMAL LOW (ref 3.5–5.1)
Sodium: 132 mmol/L — ABNORMAL LOW (ref 135–145)
Total Bilirubin: 0.9 mg/dL (ref 0.3–1.2)
Total Protein: 7.8 g/dL (ref 6.5–8.1)

## 2021-06-08 LAB — CBC WITH DIFFERENTIAL/PLATELET
Abs Immature Granulocytes: 0.1 10*3/uL — ABNORMAL HIGH (ref 0.00–0.07)
Basophils Absolute: 0.1 10*3/uL (ref 0.0–0.1)
Basophils Relative: 0 %
Eosinophils Absolute: 0 10*3/uL (ref 0.0–0.5)
Eosinophils Relative: 0 %
HCT: 38.8 % (ref 36.0–46.0)
Hemoglobin: 13.3 g/dL (ref 12.0–15.0)
Immature Granulocytes: 1 %
Lymphocytes Relative: 8 %
Lymphs Abs: 1.4 10*3/uL (ref 0.7–4.0)
MCH: 32.1 pg (ref 26.0–34.0)
MCHC: 34.3 g/dL (ref 30.0–36.0)
MCV: 93.7 fL (ref 80.0–100.0)
Monocytes Absolute: 1.1 10*3/uL — ABNORMAL HIGH (ref 0.1–1.0)
Monocytes Relative: 6 %
Neutro Abs: 15.6 10*3/uL — ABNORMAL HIGH (ref 1.7–7.7)
Neutrophils Relative %: 85 %
Platelets: 302 10*3/uL (ref 150–400)
RBC: 4.14 MIL/uL (ref 3.87–5.11)
RDW: 11.5 % (ref 11.5–15.5)
WBC: 18.3 10*3/uL — ABNORMAL HIGH (ref 4.0–10.5)
nRBC: 0 % (ref 0.0–0.2)

## 2021-06-08 LAB — LACTIC ACID, PLASMA: Lactic Acid, Venous: 1.5 mmol/L (ref 0.5–1.9)

## 2021-06-08 LAB — LIPASE, BLOOD: Lipase: 23 U/L (ref 11–51)

## 2021-06-08 MED ORDER — ONDANSETRON HCL 4 MG/2ML IJ SOLN
4.0000 mg | Freq: Once | INTRAMUSCULAR | Status: AC
  Administered 2021-06-08: 4 mg via INTRAVENOUS
  Filled 2021-06-08: qty 2

## 2021-06-08 MED ORDER — LACTATED RINGERS IV BOLUS
1000.0000 mL | Freq: Once | INTRAVENOUS | Status: AC
  Administered 2021-06-08: 1000 mL via INTRAVENOUS

## 2021-06-08 MED ORDER — DILTIAZEM LOAD VIA INFUSION
10.0000 mg | Freq: Once | INTRAVENOUS | Status: AC
  Administered 2021-06-08: 10 mg via INTRAVENOUS
  Filled 2021-06-08: qty 10

## 2021-06-08 MED ORDER — DILTIAZEM HCL 25 MG/5ML IV SOLN
INTRAVENOUS | Status: AC
  Filled 2021-06-08: qty 5

## 2021-06-08 MED ORDER — IOHEXOL 300 MG/ML  SOLN
100.0000 mL | Freq: Once | INTRAMUSCULAR | Status: AC | PRN
  Administered 2021-06-08: 100 mL via INTRAVENOUS

## 2021-06-08 MED ORDER — FENTANYL CITRATE PF 50 MCG/ML IJ SOSY
50.0000 ug | PREFILLED_SYRINGE | Freq: Once | INTRAMUSCULAR | Status: AC
  Administered 2021-06-08: 50 ug via INTRAVENOUS
  Filled 2021-06-08: qty 1

## 2021-06-08 MED ORDER — DILTIAZEM HCL-DEXTROSE 125-5 MG/125ML-% IV SOLN (PREMIX)
5.0000 mg/h | INTRAVENOUS | Status: DC
  Administered 2021-06-08: 5 mg/h via INTRAVENOUS
  Filled 2021-06-08: qty 125

## 2021-06-08 MED ORDER — SODIUM CHLORIDE 0.9 % IV SOLN
1.0000 g | Freq: Once | INTRAVENOUS | Status: AC
  Administered 2021-06-08: 1 g via INTRAVENOUS
  Filled 2021-06-08: qty 10

## 2021-06-08 MED ORDER — ACETAMINOPHEN 500 MG PO TABS
1000.0000 mg | ORAL_TABLET | Freq: Once | ORAL | Status: AC
  Administered 2021-06-08: 1000 mg via ORAL
  Filled 2021-06-08: qty 2

## 2021-06-08 NOTE — ED Provider Notes (Signed)
?[facility] ?Provider Note ? ? ?CSN: [id number] ?Arrival date & time: 06/08/21  1654 ? ?  ? ?History ? ?Chief Complaint  ?Patient presents with  ? Abdominal Pain  ? ? ?Kelli Rodriguez is a 87 y.o. female. ? ? ?Abdominal Pain ? ?Patient with medical seen notable for stable aortic aneurysm, CKD, COPD, hypertension presents today due to right-sided abdominal pain.  Surgical history is notable for tubal ligation, appendectomy, abdominal hysterectomy. ? ?Patient states started having right-sided abdominal pain yesterday, pain is constant and radiates to her back primarily to the right flank.  Denies any dysuria or hematuria, having regular bowel movements.  States yesterday she had an episode of emesis and nausea, none today.  Denies any chest pain or shortness of breath. ? ?Patient has been followed by vascular for the ascending aortic aneurysm, last MRI chest was in 2019.  Because its been stable for 5 years patient states they are doing any routine annual imaging at this point. ? ?Home Medications ?Prior to Admission medications   ?Medication Sig Start Date End Date Taking? Authorizing Provider  ?atorvastatin (LIPITOR) 20 MG tablet Take 20 mg by mouth daily. 02/11/20   [provider]  ?Calcium Carbonate-Vit D-Min (CALCIUM 1200 PO) Take by mouth daily.    [provider]  ?HYDROcodone-acetaminophen (NORCO/VICODIN) 5-325 MG tablet Take 1 tablet by mouth every 6 (six) hours as needed for moderate pain. Post op surgical pain ?Patient not taking: Reported on 07/17/2020 07/07/20   Magnant, Gerrianne Scale, PA-C  ?losartan (COZAAR) 100 MG tablet Take 1 tablet (100 mg total) by mouth daily with breakfast. 07/15/15   Timmothy Euler, MD  ?metoprolol succinate (TOPROL-XL) 25 MG 24 hr tablet TAKE ONE TABLET BY MOUTH EVERY EVENING (MUST  BE  SEEN  BEFORE  NEXT  REFILL) 06/09/16   Eustaquio Maize, MD  ?metoprolol tartrate (LOPRESSOR) 25 MG tablet Take 25 mg by mouth daily. 12/26/19   [provider]  ?Multiple Vitamin (MULTIVITAMIN WITH MINERALS) TABS Take 1 tablet by mouth daily.    [provider]  ?   ? ?Allergies    ?Patient has no known allergies.   ? ?Review of Systems   ?Review of Systems  ?Gastrointestinal:  Positive for abdominal pain.  ? ?Physical Exam ?Updated Vital Signs ?BP (!) 157/75 (BP Location: Left Arm)   Pulse (!) 130   Temp (!) 103 ?F (39.4 ?C) (Oral)   Resp 20   Ht '5\' 4"'$  (1.626 m)   Wt 81.6 kg   SpO2 93%   BMI 30.90 kg/m?  ?Physical Exam ?Vitals and nursing note reviewed. Exam conducted with a chaperone present.  ?Constitutional:   ?   Appearance: Normal appearance. She is ill-appearing.  ?HENT:  ?   Head: Normocephalic and atraumatic.  ?Eyes:  ?   General: No scleral icterus.    ?   Right eye: No discharge.     ?   Left eye: No discharge.  ?   Extraocular Movements: Extraocular movements intact.  ?   Pupils: Pupils are equal, round, and reactive to light.  ?Cardiovascular:  ?   Rate and Rhythm: Regular rhythm. Tachycardia present.  ?   Pulses: Normal pulses.  ?   Heart sounds: Normal heart sounds. No murmur heard. ?  No friction rub. No gallop.  ?Pulmonary:  ?   Effort: Pulmonary effort is normal. No respiratory distress.  ?   Breath sounds: Normal breath sounds.  ?Abdominal:  ?  General: Abdomen is flat. Bowel sounds are normal. There is no distension.  ?   Palpations: Abdomen is soft.  ?   Tenderness: There is abdominal tenderness. There is right CVA tenderness and left CVA tenderness.  ?Skin: ?   General: Skin is warm and dry.  ?   Coloration: Skin is not jaundiced.  ?Neurological:  ?   Mental Status: She is alert. Mental status is at baseline.  ?   Coordination: Coordination normal.  ? ? ?ED Results / Procedures / Treatments   ?Labs ?(all labs ordered are listed, but only abnormal results are displayed) ?Labs Reviewed  ?COMPREHENSIVE METABOLIC PANEL - Abnormal; Notable for the following components:  ?    Result Value  ? Sodium 132 (*)   ? Potassium 3.3  (*)   ? Chloride 97 (*)   ? Glucose, Bld 142 (*)   ? All other components within normal limits  ?URINALYSIS, ROUTINE W REFLEX MICROSCOPIC - Abnormal; Notable for the following components:  ? APPearance TURBID (*)   ? Hgb urine dipstick MODERATE (*)   ? Protein, ur 100 (*)   ? Nitrite POSITIVE (*)   ? Leukocytes,Ua LARGE (*)   ? RBC / HPF >50 (*)   ? WBC, UA >50 (*)   ? Bacteria, UA RARE (*)   ? All other components within normal limits  ?CBC WITH DIFFERENTIAL/PLATELET - Abnormal; Notable for the following components:  ? WBC 18.3 (*)   ? Neutro Abs 15.6 (*)   ? Monocytes Absolute 1.1 (*)   ? Abs Immature Granulocytes 0.10 (*)   ? All other components within normal limits  ?URINE CULTURE  ?CULTURE, BLOOD (ROUTINE X 2)  ?CULTURE, BLOOD (ROUTINE X 2)  ?LIPASE, BLOOD  ?LACTIC ACID, PLASMA  ?LACTIC ACID, PLASMA  ? ? ?EKG ?EKG Interpretation ? ?Date/Time:  Monday Jun 08 2021 17:24:50 EDT ?Ventricular Rate:  105 ?PR Interval:  176 ?QRS Duration: 88 ?QT Interval:  348 ?QTC Calculation: 459 ?R Axis:   7 ?Text Interpretation: Sinus tachycardia Abnormal ECG When compared with ECG of 24-Oct-2014 11:06, Nonspecific T wave abnormality now evident in Lateral leads Confirmed by Kommor, [place] [phone]) on 06/08/2021 5:48:45 PM ? ?Radiology ?CT Abdomen Pelvis W Contrast ? ?Result Date: 06/08/2021 ?CLINICAL DATA:  Abdominal pain, post-op Flank pain, kidney stone suspected Nausea/vomiting EXAM: CT ABDOMEN AND PELVIS WITH CONTRAST TECHNIQUE: Multidetector CT imaging of the abdomen and pelvis was performed using the standard protocol following bolus administration of intravenous contrast. RADIATION DOSE REDUCTION: This exam was performed according to the departmental dose-optimization program which includes automated exposure control, adjustment of the mA and/or kV according to patient size and/or use of iterative reconstruction technique. CONTRAST:  111m OMNIPAQUE IOHEXOL 300 MG/ML  SOLN COMPARISON:  No prior abdominal imaging available.  FINDINGS: Lower chest: No acute airspace disease or pleural effusion. Tiny hiatal hernia. Hepatobiliary: Mm hypodensity in the right lobe of the liver, series 2, image 29, too small to characterize but likely small cyst or hemangioma. There is focal fatty infiltration adjacent to the falciform ligament. No suspicious liver lesion. Gallbladder physiologically distended, no calcified stone. No biliary dilatation. Pancreas: No ductal dilatation or inflammation. Spleen: Normal in size without focal abnormality. Adrenals/Urinary Tract: No adrenal nodule. There is marked atrophy of the upper and mid left kidney, lower pole cortex appears in thickness. Compensatory hypertrophy of the right kidney. There is heterogeneous enhancement of the lower pole of the left kidney. Mild urothelial thickening and enhancement about both ureters, more  so on the right and distally, for example series 2, image 62. Tiny subcentimeter hypodense lesions in the lower right kidney are too small to characterize but likely small cysts. There is no perinephric collection. No dedicated follow-up is recommended. Mild wall thickening and enhancement of the urinary bladder. No visualized urolithiasis. Stomach/Bowel: Small hiatal hernia. The stomach is decompressed. There is no small bowel obstruction or inflammation. Appendix is not confidently visualized. Colonic diverticulosis without diverticulitis. The transverse colon is redundant. No colonic inflammation. Vascular/Lymphatic: Moderate aortic atherosclerosis. Suspect high-grade stenosis of the left renal artery. Patent portal, splenic, and mesenteric veins. No abdominopelvic adenopathy. Reproductive: Hysterectomy. No adnexal mass. Ovaries tentatively visualized and quiescent. Other: No free air, free fluid, or intra-abdominal fluid collection. Musculoskeletal: There are no acute or suspicious osseous abnormalities. Facet hypertrophy in the lumbar spine. IMPRESSION: 1. Mild wall thickening and  enhancement of the urinary bladder and ureters, suspicious for cystitis and ascending urinary tract infection. 2. Heterogeneous enhancement of the lower pole of the left kidney, suspicious for pyelonephritis. 3. Marked atro

## 2021-06-08 NOTE — H&P (Signed)
?History and Physical  ? ? ?Patient: Kelli Rodriguez WCB:[id number] DOB: 05-15-1934 ?DOA: 06/08/2021 ?DOS: the patient was seen and examined on 06/08/2021 ?PCP: Curlene Labrum, MD  ?Patient coming from: Home ? ?Chief Complaint:  ?Chief Complaint  ?Patient presents with  ? Abdominal Pain  ? ?HPI: Kelli Rodriguez is a 87 y.o. female with medical history significant of hyperlipidemia, essential hypertension, vitamin D deficiency who presents to the emergency department due to 1 day onset of back pain and abnormal pain.  Patient complained  of right flank pain which started yesterday with radiation to right lower quadrant, this was associated with nausea and vomiting.  Pain continued today, but she has not had any vomiting, she denies chest pain, fever, chills, shortness of breath ? ?ED Course:  ?In the emergency department, he was febrile, tachycardic and intermittently tachypneic.  BP was 198/116, she became hypoxic with an O2 sat of 87%, supplemental oxygen was provided at 12 PM with improvement of O2 sat to 93-95%. Work-up in the ED showed normal CBC except for leukocytosis, BMP was positive for hyponatremia, hypokalemia and hyperglycemia.  Lactic acid was normal at 1.5, urinalysis was positive for UTI, lipase 23, urine culture pending ?Chest x-ray showed pulmonary hypoinflation. ?CT abdomen and pelvis with contrast showed 1. Mild wall thickening and enhancement of the urinary bladder and ureters, suspicious for cystitis and ascending urinary tract infection. 2. Heterogeneous enhancement of the lower pole of the left kidney, suspicious for pyelonephritis. ?Patient was treated with Tylenol due to fever, IV ceftriaxone was started due to pyelonephritis, IV hydration was provided, Zofran was given.  Patient was started on IV Cardizem due to high blood pressure and arrhythmia.  Hospitalist was asked to admit patient for additional management. ? ?Review of Systems: ?Review of systems as noted in the HPI. All other  systems reviewed and are negative. ? ? ?Past Medical History:  ?Diagnosis Date  ? Anemia   ? Aneurysm ([place]) 2014  ? Aneurysm of aorta ([place]) 2014  ? Aneurysm, thoracic   ? Arthritis   ? knees and hands  ? Ascending aortic aneurysm ([place])   ? followed by Dr. Cyndia Bent, 4.3 cm 08/2011 by [facility]  ? Blood transfusion without reported diagnosis   ? Bronchitis, chronic ([place])   ? Cancer [facility])   ? right breast cancer s/p mastectomy '00  ? Chronic kidney disease   ? Constipation due to pain medication   ? COPD (chronic obstructive pulmonary disease) ([place])   ? uses o2 @ night  ? Hypertension   ? ?Past Surgical History:  ?Procedure Laterality Date  ? ABDOMINAL HYSTERECTOMY    ? APPENDECTOMY    ? BLADDER SURGERY    ? tacked  ? BREAST SURGERY    ? BROW LIFT Bilateral 10/31/2014  ? Procedure: BLEPHAROPLASTY;  Surgeon: Baruch Goldmann, MD;  Location: [facility];  Service: Ophthalmology;  Laterality: Bilateral;  ? EYE SURGERY    ? bilateral--lens implant left  ? JOINT REPLACEMENT    ? KNEE ARTHROSCOPY    ? right  ? MASTECTOMY    ? right    2002  ? right breast implant    ? TOTAL KNEE ARTHROPLASTY  02/01/2012  ? RIGHT KNEE  ? TOTAL KNEE ARTHROPLASTY  02/01/2012  ? Procedure: TOTAL KNEE ARTHROPLASTY;  Surgeon: Meredith Pel, MD;  Location: [facility];  Service: Orthopedics;  Laterality: Right;  Right total knee arthroplasty  ? TOTAL KNEE ARTHROPLASTY Left 08/22/2012  ? Procedure: TOTAL KNEE ARTHROPLASTY- left;  Surgeon: Meredith Pel, MD;  Location: [facility];  Service: Orthopedics;  Laterality: Left;  ? TUBAL LIGATION    ? 1968  ? ? ?Social History:  reports that she has never smoked. She has never used smokeless tobacco. She reports that she does not drink alcohol and does not use drugs. ? ? ?No Known Allergies ? ?Family History  ?Problem Relation Age of Onset  ? Cancer Neg Hx   ? Heart disease Neg Hx   ?  ? ?Prior to Admission medications   ?Medication Sig Start Date End Date Taking? Authorizing Provider  ?atorvastatin (LIPITOR) 20 MG tablet Take  20 mg by mouth daily. 02/11/20   [provider]  ?Calcium Carbonate-Vit D-Min (CALCIUM 1200 PO) Take by mouth daily.    [provider]  ?HYDROcodone-acetaminophen (NORCO/VICODIN) 5-325 MG tablet Take 1 tablet by mouth every 6 (six) hours as needed for moderate pain. Post op surgical pain ?Patient not taking: Reported on 07/17/2020 07/07/20   Magnant, Gerrianne Scale, PA-C  ?losartan (COZAAR) 100 MG tablet Take 1 tablet (100 mg total) by mouth daily with breakfast. 07/15/15   Timmothy Euler, MD  ?metoprolol succinate (TOPROL-XL) 25 MG 24 hr tablet TAKE ONE TABLET BY MOUTH EVERY EVENING (MUST  BE  SEEN  BEFORE  NEXT  REFILL) 06/09/16   Eustaquio Maize, MD  ?metoprolol tartrate (LOPRESSOR) 25 MG tablet Take 25 mg by mouth daily. 12/26/19   [provider]  ?Multiple Vitamin (MULTIVITAMIN WITH MINERALS) TABS Take 1 tablet by mouth daily.    [provider]  ? ? ?Physical Exam: ?BP 127/66   Pulse (!) 102   Temp 99.7 ?F (37.6 ?C) (Oral)   Resp 18   Ht '5\' 4"'$  (1.626 m)   Wt 81.6 kg   SpO2 95%   BMI 30.90 kg/m?  ? ?General: 87 y.o. year-old female ill appearing, but in no acute distress.  Alert and oriented x3. ?HEENT: NCAT, EOMI ?Neck: Supple, trachea medial ?Cardiovascular: Tachycardia, regular rate and rhythm with no rubs or gallops.  No thyromegaly or JVD noted.  No lower extremity edema. 2/4 pulses in all 4 extremities. ?Respiratory: Clear to auscultation with no wheezes or rales. Good inspiratory effort. ?Abdomen: Soft, right CVA tenderness, tender to palpation of abdomen.  Nondistended with normal bowel sounds x4 quadrants. ?Muskuloskeletal: No cyanosis, clubbing or edema noted bilaterally ?Neuro: CN II-XII intact, strength 5/5 x 4, sensation, reflexes intact ?Skin: No ulcerative lesions noted or rashes ?Psychiatry: Judgement and insight appear normal. Mood is appropriate for condition and setting ?   ?   ?   ?Labs on Admission:  ?Basic Metabolic Panel: ?Recent Labs  ?Lab  06/08/21 ?1741  ?NA 132*  ?K 3.3*  ?CL 97*  ?CO2 24  ?GLUCOSE 142*  ?BUN 15  ?CREATININE 0.82  ?CALCIUM 9.0  ? ?Liver Function Tests: ?Recent Labs  ?Lab 06/08/21 ?1741  ?AST 22  ?ALT 24  ?ALKPHOS 70  ?BILITOT 0.9  ?PROT 7.8  ?ALBUMIN 4.1  ? ?Recent Labs  ?Lab 06/08/21 ?1741  ?LIPASE 23  ? ?No results for input(s): AMMONIA in the last 168 hours. ?CBC: ?Recent Labs  ?Lab 06/08/21 ?1741  ?WBC 18.3*  ?NEUTROABS 15.6*  ?HGB 13.3  ?HCT 38.8  ?MCV 93.7  ?PLT 302  ? ?Cardiac Enzymes: ?No results for input(s): CKTOTAL, CKMB, CKMBINDEX, TROPONINI in the last 168 hours. ? ?BNP (last 3 results) ?No results for input(s): BNP in the last 8760 hours. ? ?ProBNP (last 3 results) ?No  results for input(s): PROBNP in the last 8760 hours. ? ?CBG: ?No results for input(s): GLUCAP in the last 168 hours. ? ?Radiological Exams on Admission: ?CT Abdomen Pelvis W Contrast ? ?Result Date: 06/08/2021 ?CLINICAL DATA:  Abdominal pain, post-op Flank pain, kidney stone suspected Nausea/vomiting EXAM: CT ABDOMEN AND PELVIS WITH CONTRAST TECHNIQUE: Multidetector CT imaging of the abdomen and pelvis was performed using the standard protocol following bolus administration of intravenous contrast. RADIATION DOSE REDUCTION: This exam was performed according to the departmental dose-optimization program which includes automated exposure control, adjustment of the mA and/or kV according to patient size and/or use of iterative reconstruction technique. CONTRAST:  17m OMNIPAQUE IOHEXOL 300 MG/ML  SOLN COMPARISON:  No prior abdominal imaging available. FINDINGS: Lower chest: No acute airspace disease or pleural effusion. Tiny hiatal hernia. Hepatobiliary: Mm hypodensity in the right lobe of the liver, series 2, image 29, too small to characterize but likely small cyst or hemangioma. There is focal fatty infiltration adjacent to the falciform ligament. No suspicious liver lesion. Gallbladder physiologically distended, no calcified stone. No biliary dilatation.  Pancreas: No ductal dilatation or inflammation. Spleen: Normal in size without focal abnormality. Adrenals/Urinary Tract: No adrenal nodule. There is marked atrophy of the upper and mid left kidney, lower pole cortex ap

## 2021-06-08 NOTE — ED Triage Notes (Signed)
Pt having back pain, abdominal pain with nausea, no urinary symptoms. ?

## 2021-06-09 DIAGNOSIS — E876 Hypokalemia: Secondary | ICD-10-CM

## 2021-06-09 DIAGNOSIS — D72829 Elevated white blood cell count, unspecified: Secondary | ICD-10-CM

## 2021-06-09 DIAGNOSIS — I160 Hypertensive urgency: Secondary | ICD-10-CM

## 2021-06-09 DIAGNOSIS — E782 Mixed hyperlipidemia: Secondary | ICD-10-CM

## 2021-06-09 DIAGNOSIS — E669 Obesity, unspecified: Secondary | ICD-10-CM

## 2021-06-09 DIAGNOSIS — E559 Vitamin D deficiency, unspecified: Secondary | ICD-10-CM

## 2021-06-09 DIAGNOSIS — A419 Sepsis, unspecified organism: Secondary | ICD-10-CM

## 2021-06-09 DIAGNOSIS — N12 Tubulo-interstitial nephritis, not specified as acute or chronic: Secondary | ICD-10-CM | POA: Diagnosis not present

## 2021-06-09 DIAGNOSIS — E871 Hypo-osmolality and hyponatremia: Secondary | ICD-10-CM

## 2021-06-09 DIAGNOSIS — J9601 Acute respiratory failure with hypoxia: Secondary | ICD-10-CM | POA: Diagnosis not present

## 2021-06-09 DIAGNOSIS — N10 Acute tubulo-interstitial nephritis: Secondary | ICD-10-CM | POA: Diagnosis not present

## 2021-06-09 DIAGNOSIS — R6520 Severe sepsis without septic shock: Secondary | ICD-10-CM

## 2021-06-09 LAB — GLUCOSE, CAPILLARY: Glucose-Capillary: 126 mg/dL — ABNORMAL HIGH (ref 70–99)

## 2021-06-09 LAB — COMPREHENSIVE METABOLIC PANEL
ALT: 17 U/L (ref 0–44)
AST: 14 U/L — ABNORMAL LOW (ref 15–41)
Albumin: 2.8 g/dL — ABNORMAL LOW (ref 3.5–5.0)
Alkaline Phosphatase: 49 U/L (ref 38–126)
Anion gap: 7 (ref 5–15)
BUN: 12 mg/dL (ref 8–23)
CO2: 23 mmol/L (ref 22–32)
Calcium: 7.2 mg/dL — ABNORMAL LOW (ref 8.9–10.3)
Chloride: 106 mmol/L (ref 98–111)
Creatinine, Ser: 0.72 mg/dL (ref 0.44–1.00)
GFR, Estimated: >60 mL/min (ref >60)
Glucose, Bld: 124 mg/dL — ABNORMAL HIGH (ref 70–99)
Potassium: 2.7 mmol/L — CL (ref 3.5–5.1)
Sodium: 136 mmol/L (ref 135–145)
Total Bilirubin: 0.9 mg/dL (ref 0.3–1.2)
Total Protein: 5.6 g/dL — ABNORMAL LOW (ref 6.5–8.1)

## 2021-06-09 LAB — CBC
HCT: 30.7 % — ABNORMAL LOW (ref 36.0–46.0)
Hemoglobin: 10.4 g/dL — ABNORMAL LOW (ref 12.0–15.0)
MCH: 32.4 pg (ref 26.0–34.0)
MCHC: 33.9 g/dL (ref 30.0–36.0)
MCV: 95.6 fL (ref 80.0–100.0)
Platelets: 214 10*3/uL (ref 150–400)
RBC: 3.21 MIL/uL — ABNORMAL LOW (ref 3.87–5.11)
RDW: 11.7 % (ref 11.5–15.5)
WBC: 25.1 10*3/uL — ABNORMAL HIGH (ref 4.0–10.5)
nRBC: 0 % (ref 0.0–0.2)

## 2021-06-09 LAB — PHOSPHORUS: Phosphorus: 3.5 mg/dL (ref 2.5–4.6)

## 2021-06-09 LAB — MAGNESIUM: Magnesium: 1.5 mg/dL — ABNORMAL LOW (ref 1.7–2.4)

## 2021-06-09 LAB — LACTIC ACID, PLASMA: Lactic Acid, Venous: 1.5 mmol/L (ref 0.5–1.9)

## 2021-06-09 LAB — MRSA NEXT GEN BY PCR, NASAL: MRSA by PCR Next Gen: NOT DETECTED

## 2021-06-09 LAB — APTT: aPTT: 30 seconds (ref 24–36)

## 2021-06-09 MED ORDER — SODIUM CHLORIDE 0.9 % IV SOLN
1.0000 g | INTRAVENOUS | Status: DC
  Administered 2021-06-09 – 2021-06-10 (×2): 1 g via INTRAVENOUS
  Filled 2021-06-09 (×2): qty 10

## 2021-06-09 MED ORDER — POTASSIUM CHLORIDE CRYS ER 20 MEQ PO TBCR
40.0000 meq | EXTENDED_RELEASE_TABLET | Freq: Once | ORAL | Status: AC
Start: 2021-06-09 — End: 2021-06-09
  Administered 2021-06-09: 40 meq via ORAL
  Filled 2021-06-09: qty 2

## 2021-06-09 MED ORDER — ADULT MULTIVITAMIN W/MINERALS CH
1.0000 | ORAL_TABLET | Freq: Every day | ORAL | Status: DC
  Administered 2021-06-09 – 2021-06-10 (×2): 1 via ORAL
  Filled 2021-06-09 (×2): qty 1

## 2021-06-09 MED ORDER — ATORVASTATIN CALCIUM 20 MG PO TABS
20.0000 mg | ORAL_TABLET | Freq: Every day | ORAL | Status: DC
  Administered 2021-06-09 – 2021-06-10 (×2): 20 mg via ORAL
  Filled 2021-06-09 (×2): qty 1

## 2021-06-09 MED ORDER — OYSTER SHELL CALCIUM/D3 500-5 MG-MCG PO TABS
2.0000 | ORAL_TABLET | Freq: Every day | ORAL | Status: DC
  Administered 2021-06-09 – 2021-06-10 (×2): 2 via ORAL
  Filled 2021-06-09 (×2): qty 2

## 2021-06-09 MED ORDER — LOSARTAN POTASSIUM 50 MG PO TABS
100.0000 mg | ORAL_TABLET | Freq: Every day | ORAL | Status: DC
Start: 2021-06-09 — End: 2021-06-10
  Administered 2021-06-09 – 2021-06-10 (×2): 100 mg via ORAL
  Filled 2021-06-09 (×2): qty 2

## 2021-06-09 MED ORDER — CALCIUM 1200 1200-1000 MG-UNIT PO CHEW
CHEWABLE_TABLET | Freq: Every day | ORAL | Status: DC
Start: 2021-06-09 — End: 2021-06-09

## 2021-06-09 MED ORDER — ACETAMINOPHEN 325 MG PO TABS
650.0000 mg | ORAL_TABLET | Freq: Four times a day (QID) | ORAL | Status: DC | PRN
Start: 2021-06-09 — End: 2021-06-10
  Administered 2021-06-09 – 2021-06-10 (×3): 650 mg via ORAL
  Filled 2021-06-09 (×3): qty 2

## 2021-06-09 MED ORDER — SODIUM CHLORIDE 0.9 % IV SOLN: INTRAVENOUS | Status: AC

## 2021-06-09 MED ORDER — ENOXAPARIN SODIUM 40 MG/0.4ML IJ SOSY
40.0000 mg | PREFILLED_SYRINGE | Freq: Every day | INTRAMUSCULAR | Status: DC
  Administered 2021-06-09 – 2021-06-10 (×2): 40 mg via SUBCUTANEOUS
  Filled 2021-06-09 (×2): qty 0.4

## 2021-06-09 MED ORDER — POTASSIUM CHLORIDE 10 MEQ/100ML IV SOLN
10.0000 meq | INTRAVENOUS | Status: AC
  Administered 2021-06-09 (×2): 10 meq via INTRAVENOUS
  Filled 2021-06-09 (×2): qty 100

## 2021-06-09 MED ORDER — METOPROLOL TARTRATE 25 MG PO TABS
25.0000 mg | ORAL_TABLET | Freq: Two times a day (BID) | ORAL | Status: DC
  Administered 2021-06-09 – 2021-06-10 (×3): 25 mg via ORAL
  Filled 2021-06-09 (×4): qty 1

## 2021-06-09 MED ORDER — ONDANSETRON HCL 4 MG/2ML IJ SOLN: 4.0000 mg | Freq: Four times a day (QID) | INTRAMUSCULAR | Status: DC | PRN

## 2021-06-09 MED ORDER — POTASSIUM CHLORIDE CRYS ER 20 MEQ PO TBCR
40.0000 meq | EXTENDED_RELEASE_TABLET | ORAL | Status: AC
  Administered 2021-06-09 (×2): 40 meq via ORAL
  Filled 2021-06-09 (×2): qty 2

## 2021-06-09 MED ORDER — MAGNESIUM SULFATE 2 GM/50ML IV SOLN
2.0000 g | Freq: Once | INTRAVENOUS | Status: AC
Start: 2021-06-09 — End: 2021-06-09
  Administered 2021-06-09: 2 g via INTRAVENOUS
  Filled 2021-06-09: qty 50

## 2021-06-09 MED ORDER — CHLORHEXIDINE GLUCONATE CLOTH 2 % EX PADS
6.0000 | MEDICATED_PAD | Freq: Every day | CUTANEOUS | Status: DC
  Administered 2021-06-09 – 2021-06-10 (×2): 6 via TOPICAL

## 2021-06-09 NOTE — Progress Notes (Signed)
Patient's family brought her hearing aids, updated patient belongings documentation.  ?

## 2021-06-09 NOTE — Assessment & Plan Note (Addendum)
-  In the setting of pyelonephritis/sepsis ?-Continue to maintain adequate hydration ?-Follow WBCs trend with repeat CBC at follow up visit to assess resolution of leukocytosis. ?-complete antibiotic therapy as prescribed. ?

## 2021-06-09 NOTE — Assessment & Plan Note (Addendum)
-  Continue the use of calcium carbonate and vitamin D ?

## 2021-06-09 NOTE — Assessment & Plan Note (Addendum)
-  In the setting of dehydration, continue use of diuretics and GI losses. ?-Decreased oral intake in the setting of acute illness also playing a role. ?-Repleted and is stable at discharge ?-Patient advised to maintain adequate nutrition and hydration. ?-Repeat basic metabolic panel to follow electrolytes stability. ? ?

## 2021-06-09 NOTE — Assessment & Plan Note (Addendum)
-  Secondary to pyelonephritis ?-Excellent response to fluid resuscitation and IV antibiotics ?-At discharge patient was afebrile for 48 hours and tolerating oral meds without problems. ?-Discharged with 5 more days of twice a day cefdinir to complete antibiotic therapy. ?-Cultures demonstrating the presence of E. coli in her urine. ? ?

## 2021-06-09 NOTE — Assessment & Plan Note (Addendum)
-  Patient met sepsis criteria at time of admission ?-Adequately improved with treatment and demonstrating resolution of sepsis features at discharge. ?-Advised to continue maintaining adequate hydration and to use as needed Tylenol for comfort. ?-As needed prescription for PDN to assist with any dysuria symptoms has been provided. ?-Patient discharged home on oral cefdinir twice a day for 5 more days to complete antibiotic therapy. ?-Follow-up with PCP in 10 days has been recommended. ?

## 2021-06-09 NOTE — Plan of Care (Signed)

## 2021-06-09 NOTE — Assessment & Plan Note (Addendum)
-  Continue the use of Lipitor. -Heart healthy diet discussed with patient. 

## 2021-06-09 NOTE — Assessment & Plan Note (Addendum)
-  Currently stable/improved  ?-Patient required initially the use of Cardizem drip on presentation to the compounding of hypertensive urgency. ?-Home antihypertensive agent has been resumed and patient has been instructed to maintain adequate hydration and to follow heart healthy/low-sodium diet. ?-Reassess blood pressure at follow-up visit with further adjustment to antihypertensive agents as required. ?

## 2021-06-09 NOTE — Assessment & Plan Note (Addendum)
-  Transient In the setting of sepsis ?-Chest x-ray not demonstrating acute infiltrates ?-After overall stabilization from sepsis presentation patient is off oxygen supplementation and not demonstrating any tachypnea or use of accessory muscles. ?-Continue supportive care. ?-no oxygen needed at discharge  ?

## 2021-06-09 NOTE — Assessment & Plan Note (Addendum)
-  In the setting of decreased oral intake and GI losses (patient reported nausea/vomiting prior to admission). ?-Patient was also taking diuretics, most likely playing a role in her low potassium as well. ?-Electrolytes repleted and within normal limits at discharge ?-Recommending repeat basic metabolic panel at follow-up visit to assess electrolytes trend/stability. ?

## 2021-06-09 NOTE — Assessment & Plan Note (Addendum)
-  After being unable to keep home antihypertensive agents down in the acute presentation of sepsis with nausea/vomiting at home. ?-Excellent response to Cardizem drip with subsequent transition back to home oral agents now that she is feeling better and tolerating p.o.'s. ?-Heart healthy diet discussed with patient. ?-Hypertensive urgency resolved. ?

## 2021-06-09 NOTE — Progress Notes (Signed)
?Progress Note ? ? ?Patient: Kelli Rodriguez UXN:[id number] DOB: 1934-11-13 DOA: 06/08/2021     1 ?DOS: the patient was seen and examined on 06/09/2021 ?  ?Brief hospital course: ?As per H&P written by Dr. Josephine Cables on 06/08/2021 ?Kelli Rodriguez is a 87 y.o. female with medical history significant of hyperlipidemia, essential hypertension, vitamin D deficiency who presents to the emergency department due to 1 day onset of back pain and abnormal pain.  Patient complained  of right flank pain which started yesterday with radiation to right lower quadrant, this was associated with nausea and vomiting.  Pain continued today, but she has not had any vomiting, she denies chest pain, fever, chills, shortness of breath ?  ?ED Course:  ?In the emergency department, he was febrile, tachycardic and intermittently tachypneic.  BP was 198/116, she became hypoxic with an O2 sat of 87%, supplemental oxygen was provided at 12 PM with improvement of O2 sat to 93-95%. Work-up in the ED showed normal CBC except for leukocytosis, BMP was positive for hyponatremia, hypokalemia and hyperglycemia.  Lactic acid was normal at 1.5, urinalysis was positive for UTI, lipase 23, urine culture pending ?Chest x-ray showed pulmonary hypoinflation. ?CT abdomen and pelvis with contrast showed 1. Mild wall thickening and enhancement of the urinary bladder and ureters, suspicious for cystitis and ascending urinary tract infection. 2. Heterogeneous enhancement of the lower pole of the left kidney, suspicious for pyelonephritis. ?Patient was treated with Tylenol due to fever, IV ceftriaxone was started due to pyelonephritis, IV hydration was provided, Zofran was given.  Patient was started on IV Cardizem due to high blood pressure and arrhythmia.  Hospitalist was asked to admit patient for additional management. ? ?Assessment and Plan: ?* Acute pyelonephritis ?- Meeting sepsis criteria at time of admission ?-Adequately improving with current  treatment ?-Continue to maintain adequate hydration, supportive care and current IV antibiotic ?-Follow culture results and sensitivity ?-Patient is stable to be transferred to telemetry bed. ?-Continue as needed antiemetics and antipyretics. ? ?Obesity (BMI 30-39.9) ?-Body mass index is 30.78 kg/m?. ?-Low calorie diet and portion control discussed with patient. ? ?Vitamin D deficiency ?- Continue the use of calcium carbonate and vitamin D ? ?Leukocytosis ?- In the setting of pyelonephritis/sepsis ?-Continue current IV antibiotic ?-Continue to maintain adequate hydration ?-Follow WBCs trend. ? ?Hypertensive urgency ?- After being unable to keep home antihypertensive agents down in the acute presentation of sepsis with nausea/vomiting at home. ?-Excellent response to Cardizem drip with subsequent transition back to home oral agents now that she is feeling better and tolerating p.o.'s. ?-Continue to follow vital sign ?-Heart healthy diet discussed with patient. ?-Hypertensive urgency resolved. ? ?Acute respiratory failure with hypoxia ([place]) ?- Transient In the setting of sepsis ?-Chest x-ray not demonstrating acute infiltrates ?-After overall stabilization from sepsis presentation patient is off oxygen supplementation and not demonstrating any tachypnea or use of accessory muscles. ?-Continue supportive care. ? ?Hyponatremia ?- In the setting of dehydration and GI losses. ?-Decreased oral intake in the setting of acute illness also playing a role. ?-Continue to maintain adequate hydration and follow electrolytes trend. ?-Patient advised to maintain adequate oral intake. ? ?Hypokalemia ?- In the setting of decreased oral intake and GI losses (patient reported nausea/vomiting prior to admission). ?-Continue to follow electrolytes trend and further replete as needed ?-Continue telemetry monitoring. ? ?Sepsis ([place]) ?- Secondary to pyelonephritis ?-Continue current IV antibiotic ?-Follow resolution of sepsis features and  culture results ?-Continue supportive care and maintain adequate hydration. ?-Patient met  sepsis criteria at time of admission: With fever, tachycardia, tachypnea, elevated WBCs and positive source of infection which was urinary tract. ? ?Essential hypertension ?- Currently stable/improved after initial need of Cardizem drip. ?-Resuming home antihypertensive agents ?-Continue to follow vital sign ?-Heart healthy diet discussed with patient. ? ?Mixed hyperlipidemia ?- Continue the use of Lipitor. ? ? ? ? ? ?Subjective:  ?Feeling better, no chest pain, currently without any nausea or vomiting.  Off oxygen supplementation and off Cardizem drip.  Patient is afebrile. ? ?Physical Exam: ?Vitals:  ? 06/09/21 1300 06/09/21 1400 06/09/21 1545 06/09/21 1600  ?BP:  (!) 99/55  (!) 161/64  ?Pulse: 71 86 80 87  ?Resp: _0 ?Temp:   98.1 ?F (36.7 ?C)   ?TempSrc:   Oral   ?SpO2: 99% 91% 96% 95%  ?Weight:      ?Height:      ? ?General exam: Alert, awake, oriented x 3; feeling better and improving.  Currently reporting no dysuria. ?Respiratory system: Clear to auscultation. Respiratory effort normal.  No using accessory muscle.  Good saturation on room air. ?Cardiovascular system:RRR. No rubs or gallops; no JVD. ?Gastrointestinal system: Abdomen is nondistended, soft and nontender. No organomegaly or masses felt. Normal bowel sounds heard. ?Central nervous system: Alert and oriented. No focal neurological deficits. ?Extremities: No cyanosis or clubbing; no edema. ?Skin: No petechiae. ?Psychiatry: Judgement and insight appear normal. Mood & affect appropriate.  ? ?Data Reviewed: ?MRSA PCR negative ?CBC demonstrating WBCs of 25.1, hemoglobin 10.4 platelet count 214 K ?Comprehensive metabolic panel with a sodium of 136, potassium 2.7, bicarb 23, BUN 12, creatinine 0.72, AST 14, ALT 17. ?Magnesium 1.5 ?Phosphorus 3.5. ? ? ?Family Communication: Sister at bedside. ? ?Disposition: ?Status is: Inpatient ? ?Remains inpatient  appropriate because: Still requiring IV antibiotics while treating sepsis due to pyelonephritis ?Follow culture results and narrow therapy as required. ?-Patient is a stable to be transferred to telemetry bed. ? ? Planned Discharge Destination: Home ? ? ?Author: ?Barton Dubois, MD ?06/09/2021 6:10 PM ? ?For on call review [email or web address].  ?

## 2021-06-09 NOTE — Assessment & Plan Note (Signed)
-  Body mass index is 30.78 kg/m?. ?-Low calorie diet and portion control discussed with patient. ?

## 2021-06-09 NOTE — TOC Progression Note (Signed)
?  Transition of Care (TOC) Screening Note ? ? ?Patient Details  ?Name: Kelli Rodriguez ?Date of Birth: [birth year] ? ? ?Transition of Care (TOC) CM/SW Contact:    ?Shade Flood, LCSW ?Phone Number: ?06/09/2021, 10:40 AM ? ? ? ?Received TOC consult for HH/DME needs. Pt does not currently have PT/OT evaluations ordered. If therapy does evaluate pt and there are HH/DME needs identified, TOC will follow up at that time. MD anticipating possible dc home tomorrow. ? ?Transition of Care Department [facility]) has reviewed patient and no TOC needs have been identified at this time. We will continue to monitor patient advancement through interdisciplinary progression rounds. If new patient transition needs arise, please place a TOC consult. ? ? ?

## 2021-06-10 DIAGNOSIS — N10 Acute tubulo-interstitial nephritis: Secondary | ICD-10-CM | POA: Diagnosis not present

## 2021-06-10 DIAGNOSIS — J9601 Acute respiratory failure with hypoxia: Secondary | ICD-10-CM | POA: Diagnosis not present

## 2021-06-10 DIAGNOSIS — N12 Tubulo-interstitial nephritis, not specified as acute or chronic: Secondary | ICD-10-CM | POA: Diagnosis not present

## 2021-06-10 DIAGNOSIS — I160 Hypertensive urgency: Secondary | ICD-10-CM | POA: Diagnosis not present

## 2021-06-10 LAB — MAGNESIUM: Magnesium: 2.1 mg/dL (ref 1.7–2.4)

## 2021-06-10 LAB — BASIC METABOLIC PANEL
Anion gap: 7 (ref 5–15)
BUN: 15 mg/dL (ref 8–23)
CO2: 23 mmol/L (ref 22–32)
Calcium: 8.2 mg/dL — ABNORMAL LOW (ref 8.9–10.3)
Chloride: 101 mmol/L (ref 98–111)
Creatinine, Ser: 0.7 mg/dL (ref 0.44–1.00)
GFR, Estimated: >60 mL/min (ref >60)
Glucose, Bld: 106 mg/dL — ABNORMAL HIGH (ref 70–99)
Potassium: 4.6 mmol/L (ref 3.5–5.1)
Sodium: 131 mmol/L — ABNORMAL LOW (ref 135–145)

## 2021-06-10 MED ORDER — ACETAMINOPHEN 325 MG PO TABS: 650.0000 mg | ORAL_TABLET | Freq: Four times a day (QID) | ORAL | 0 refills | Status: AC | PRN

## 2021-06-10 MED ORDER — PHENAZOPYRIDINE HCL 100 MG PO TABS: 100.0000 mg | ORAL_TABLET | Freq: Three times a day (TID) | ORAL | 0 refills | Status: AC | PRN

## 2021-06-10 MED ORDER — CEFDINIR 300 MG PO CAPS: 300.0000 mg | ORAL_CAPSULE | Freq: Two times a day (BID) | ORAL | 0 refills | Status: AC

## 2021-06-10 MED ORDER — METOPROLOL TARTRATE 25 MG PO TABS
25.0000 mg | ORAL_TABLET | Freq: Two times a day (BID) | ORAL | 2 refills | Status: AC
Start: 2021-06-10 — End: ?

## 2021-06-10 MED ORDER — HYDROCHLOROTHIAZIDE 25 MG PO TABS: 12.5000 mg | ORAL_TABLET | Freq: Every day | ORAL | Status: AC

## 2021-06-10 NOTE — Progress Notes (Signed)
Nsg Discharge Note ? ?Admit Date:  06/08/2021 ?Discharge date: 06/10/2021 ?  ?Kelli Rodriguez to be D/C'd Home per MD order.  AVS completed.   ?Removed IV-CDI. Reviewed d/c paperwork with patient and sister. Answered all questions. Wheeled stable patient and belongings to main entrance where she was picked up by her sister. ?Discharge Medication: ?Allergies as of 06/10/2021   ?No Known Allergies ?  ? ?  ?Medication List  ?  ? ?STOP taking these medications   ? ?HYDROcodone-acetaminophen 5-325 MG tablet ?Commonly known as: NORCO/VICODIN ?  ?metoprolol succinate 25 MG 24 hr tablet ?Commonly known as: TOPROL-XL ?  ? ?  ? ?TAKE these medications   ? ?acetaminophen 325 MG tablet ?Commonly known as: TYLENOL ?Take 2 tablets (650 mg total) by mouth every 6 (six) hours as needed for mild pain, moderate pain or fever. ?  ?atorvastatin 20 MG tablet ?Commonly known as: LIPITOR ?Take 20 mg by mouth daily. ?  ?BIOTIN PO ?Take 1 tablet by mouth daily. ?  ?CALCIUM 1200 PO ?Take 1 tablet by mouth daily. ?  ?cefdinir 300 MG capsule ?Commonly known as: OMNICEF ?Take 1 capsule (300 mg total) by mouth 2 (two) times daily. ?  ?diclofenac Sodium 1 % Gel ?Commonly known as: VOLTAREN ?Apply 1-2 g topically 4 (four) times daily as needed (pain). ?  ?hydrochlorothiazide 25 MG tablet ?Commonly known as: HYDRODIURIL ?Take 0.5 tablets (12.5 mg total) by mouth daily. ?Start taking on: Jun 12, 2021 ?What changed:  ?how much to take ?These instructions start on Jun 12, 2021. If you are unsure what to do until then, ask your doctor or other care provider. ?  ?losartan 100 MG tablet ?Commonly known as: COZAAR ?Take 1 tablet (100 mg total) by mouth daily with breakfast. ?  ?MAGNESIUM PO ?Take 1 tablet by mouth daily. ?  ?metoprolol tartrate 25 MG tablet ?Commonly known as: LOPRESSOR ?Take 1 tablet (25 mg total) by mouth 2 (two) times daily. ?What changed: when to take this ?  ?multivitamin with minerals Tabs tablet ?Take 1 tablet by mouth daily. ?   ?phenazopyridine 100 MG tablet ?Commonly known as: PYRIDIUM ?Take 1 tablet (100 mg total) by mouth 3 (three) times daily as needed for up to 5 days for pain. ?  ? ?  ? ? ?Discharge Assessment: ?Vitals:  ? 06/10/21 0857 06/10/21 1312  ?BP: (!) 170/60 (!) 153/68  ?Pulse: 84 76  ?Resp: 18 17  ?Temp: 98 ?F (36.7 ?C) 98.7 ?F (37.1 ?C)  ?SpO2: 95% 95%  ? Skin clean, dry and intact without evidence of skin break down, no evidence of skin tears noted. ?IV catheter discontinued intact. Site without signs and symptoms of complications - no redness or edema noted at insertion site, patient denies c/o pain - only slight tenderness at site.  Dressing with slight pressure applied. ? ?D/c Instructions-Education: ?Discharge instructions given to patient/family with verbalized understanding. ?D/c education completed with patient/family including follow up instructions, medication list, d/c activities limitations if indicated, with other d/c instructions as indicated by MD - patient able to verbalize understanding, all questions fully answered. ?Patient instructed to return to ED, call 911, or call MD for any changes in condition.  ?Patient escorted via [place], and D/C home via private auto. ? ?Santa Lighter, RN ?06/10/2021 5:30 PM  ?

## 2021-06-10 NOTE — Plan of Care (Signed)
?  Problem: Education: ?Goal: Knowledge of General Education information will improve ?Description: Including pain rating scale, medication(s)/side effects and non-pharmacologic comfort measures ?06/10/2021 1534 by Santa Lighter, RN ?Outcome: Adequate for Discharge ?06/10/2021 1006 by Santa Lighter, RN ?Outcome: Progressing ?  ?Problem: Health Behavior/Discharge Planning: ?Goal: Ability to manage health-related needs will improve ?06/10/2021 1534 by Santa Lighter, RN ?Outcome: Adequate for Discharge ?06/10/2021 1006 by Santa Lighter, RN ?Outcome: Progressing ?  ?Problem: Clinical Measurements: ?Goal: Ability to maintain clinical measurements within normal limits will improve ?06/10/2021 1534 by Santa Lighter, RN ?Outcome: Adequate for Discharge ?06/10/2021 1006 by Santa Lighter, RN ?Outcome: Progressing ?Goal: Will remain free from infection ?06/10/2021 1534 by Santa Lighter, RN ?Outcome: Adequate for Discharge ?06/10/2021 1006 by Santa Lighter, RN ?Outcome: Progressing ?Goal: Diagnostic test results will improve ?Outcome: Adequate for Discharge ?Goal: Respiratory complications will improve ?Outcome: Adequate for Discharge ?Goal: Cardiovascular complication will be avoided ?Outcome: Adequate for Discharge ?  ?Problem: Activity: ?Goal: Risk for activity intolerance will decrease ?Outcome: Adequate for Discharge ?  ?Problem: Nutrition: ?Goal: Adequate nutrition will be maintained ?Outcome: Adequate for Discharge ?  ?Problem: Coping: ?Goal: Level of anxiety will decrease ?Outcome: Adequate for Discharge ?  ?Problem: Elimination: ?Goal: Will not experience complications related to bowel motility ?Outcome: Adequate for Discharge ?Goal: Will not experience complications related to urinary retention ?Outcome: Adequate for Discharge ?  ?Problem: Pain Managment: ?Goal: General experience of comfort will improve ?Outcome: Adequate for Discharge ?  ?Problem: Safety: ?Goal: Ability to remain free from injury will  improve ?Outcome: Adequate for Discharge ?  ?Problem: Skin Integrity: ?Goal: Risk for impaired skin integrity will decrease ?Outcome: Adequate for Discharge ?  ?

## 2021-06-10 NOTE — Discharge Summary (Addendum)
?Physician Discharge Summary ?  ?Patient: Kelli Kelli Rodriguez MRN: [id number] DOB: [birth year]  ?Admit date:     06/08/2021  ?Discharge date: 06/10/21  ?Discharge Physician: Barton Dubois  ? ?PCP: Curlene Labrum, MD  ? ?Recommendations at discharge:  ?Repeat basic metabolic panel to follow electrolytes and renal function ?Reassess blood pressure and adjust antihypertensive treatment as needed ?Reassess patient's symptoms of dysuria for complete resolution after antibiotic therapy completed ? ?Discharge Diagnoses: ?Principal Problem: ?  Pyelonephritis ?Active Problems: ?  Mixed hyperlipidemia ?  Essential hypertension ?  Sepsis ([place]) ?  Hypokalemia ?  Hyponatremia ?  Acute respiratory failure with hypoxia ([place]) ?  Hypertensive urgency ?  Leukocytosis ?  Vitamin D deficiency ?  Obesity (BMI 30-39.9) ?CKD stage 2 ? ?Hospital Course: ?As per H&P written by Dr. Josephine Cables on 06/08/2021 ?Kelli Kelli Rodriguez is a 87 y.o. female with medical history significant of hyperlipidemia, essential hypertension, vitamin D deficiency who presents to the emergency department due to 1 day onset of back pain and abnormal pain.  Patient complained  of right flank pain which started yesterday with radiation to right lower quadrant, this was associated with nausea and vomiting.  Pain continued today, but she has not had any vomiting, she denies chest pain, fever, chills, shortness of breath ?  ?ED Course:  ?In the emergency department, he was febrile, tachycardic and intermittently tachypneic.  BP was 198/116, she became hypoxic with an O2 sat of 87%, supplemental oxygen was provided at 12 PM with improvement of O2 sat to 93-95%. Work-up in the ED showed normal CBC except for leukocytosis, BMP was positive for hyponatremia, hypokalemia and hyperglycemia.  Lactic acid was normal at 1.5, urinalysis was positive for UTI, lipase 23, urine culture pending ?Chest x-ray showed pulmonary hypoinflation. ?CT abdomen and pelvis with contrast showed 1. Mild wall  thickening and enhancement of the urinary bladder and ureters, suspicious for cystitis and ascending urinary tract infection. 2. Heterogeneous enhancement of the lower pole of the left kidney, suspicious for pyelonephritis. ?Patient was treated with Tylenol due to fever, IV ceftriaxone was started due to pyelonephritis, IV hydration was provided, Zofran was given.  Patient was started on IV Cardizem due to high blood pressure and arrhythmia.  Hospitalist was asked to admit patient for additional management. ? ?Assessment and Plan: ?* Pyelonephritis ?-Patient met sepsis criteria at time of admission ?-Adequately improved with treatment and demonstrating resolution of sepsis features at discharge. ?-Advised to continue maintaining adequate hydration and to use as needed Tylenol for comfort. ?-As needed prescription for PDN to assist with any dysuria symptoms has Kelli Rodriguez provided. ?-Patient discharged home on oral cefdinir twice a day for 5 more days to complete antibiotic therapy. ?-Follow-up with PCP in 10 days has Kelli Rodriguez recommended. ? ?Obesity (BMI 30-39.9) ?-Body mass index is 30.78 kg/m?. ?-Low calorie diet and portion control discussed with patient. ? ?Vitamin D deficiency ?-Continue the use of calcium carbonate and vitamin D ? ?Leukocytosis ?-In the setting of pyelonephritis/sepsis ?-Continue to maintain adequate hydration ?-Follow WBCs trend with repeat CBC at follow up visit to assess resolution of leukocytosis. ?-complete antibiotic therapy as prescribed. ? ?Hypertensive urgency ?-After being unable to keep home antihypertensive agents down in the acute presentation of sepsis with nausea/vomiting at home. ?-Excellent response to Cardizem drip with subsequent transition back to home oral agents now that she is feeling better and tolerating p.o.'s. ?-Heart healthy diet discussed with patient. ?-Hypertensive urgency resolved. ? ?Acute respiratory failure with hypoxia ([place]) ?-Transient In the  setting of  sepsis ?-Chest x-ray not demonstrating acute infiltrates ?-After overall stabilization from sepsis presentation patient is off oxygen supplementation and not demonstrating any tachypnea or use of accessory muscles. ?-Continue supportive care. ?-no oxygen needed at discharge  ? ?Hyponatremia ?-In the setting of dehydration, continue use of diuretics and GI losses. ?-Decreased oral intake in the setting of acute illness also playing a role. ?-Repleted and is stable at discharge ?-Patient advised to maintain adequate nutrition and hydration. ?-Repeat basic metabolic panel to follow electrolytes stability. ? ? ?Hypokalemia ?-In the setting of decreased oral intake and GI losses (patient reported nausea/vomiting prior to admission). ?-Patient was also taking diuretics, most likely playing a role in her low potassium as well. ?-Electrolytes repleted and within normal limits at discharge ?-Recommending repeat basic metabolic panel at follow-up visit to assess electrolytes trend/stability. ? ?Sepsis ([place]) ?-Secondary to pyelonephritis ?-Excellent response to fluid resuscitation and IV antibiotics ?-At discharge patient was afebrile for 48 hours and tolerating oral meds without problems. ?-Discharged with 5 more days of twice a day cefdinir to complete antibiotic therapy. ?-Cultures demonstrating the presence of E. coli in her urine. ? ? ?Essential hypertension ?-Currently stable/improved  ?-Patient required initially the use of Cardizem drip on presentation to the compounding of hypertensive urgency. ?-Home antihypertensive agent has Kelli Rodriguez resumed and patient has Kelli Rodriguez instructed to maintain adequate hydration and to follow heart healthy/low-sodium diet. ?-Reassess blood pressure at follow-up visit with further adjustment to antihypertensive agents as required. ? ?Mixed hyperlipidemia ?-Continue the use of Lipitor. ?-Heart healthy diet discussed with patient. ? ?CKD stage 2 due to HTN ?-stable and at baseline ?-continue to  maintain adequate hydration ?-continue heart healthy diet and risk factors modifications. ?-follow renal function trend  ? ?Consultants: none  ?Procedures performed: see below for x-ray reports.   ?Disposition: Home ?Diet recommendation: heart healthy and low calorie diet. ? ? ?DISCHARGE MEDICATION: ?Allergies as of 06/10/2021   ?No Known Allergies ?  ? ?  ?Medication List  ?  ? ?STOP taking these medications   ? ?HYDROcodone-acetaminophen 5-325 MG tablet ?Commonly known as: NORCO/VICODIN ?  ?metoprolol succinate 25 MG 24 hr tablet ?Commonly known as: TOPROL-XL ?  ? ?  ? ?TAKE these medications   ? ?acetaminophen 325 MG tablet ?Commonly known as: TYLENOL ?Take 2 tablets (650 mg total) by mouth every 6 (six) hours as needed for mild pain, moderate pain or fever. ?  ?atorvastatin 20 MG tablet ?Commonly known as: LIPITOR ?Take 20 mg by mouth daily. ?  ?BIOTIN PO ?Take 1 tablet by mouth daily. ?  ?CALCIUM 1200 PO ?Take 1 tablet by mouth daily. ?  ?cefdinir 300 MG capsule ?Commonly known as: OMNICEF ?Take 1 capsule (300 mg total) by mouth 2 (two) times daily. ?  ?diclofenac Sodium 1 % Gel ?Commonly known as: VOLTAREN ?Apply 1-2 g topically 4 (four) times daily as needed (pain). ?  ?hydrochlorothiazide 25 MG tablet ?Commonly known as: HYDRODIURIL ?Take 0.5 tablets (12.5 mg total) by mouth daily. ?Start taking on: Jun 12, 2021 ?What changed:  ?how much to take ?These instructions start on Jun 12, 2021. If you are unsure what to do until then, ask your doctor or other care provider. ?  ?losartan 100 MG tablet ?Commonly known as: COZAAR ?Take 1 tablet (100 mg total) by mouth daily with breakfast. ?  ?MAGNESIUM PO ?Take 1 tablet by mouth daily. ?  ?metoprolol tartrate 25 MG tablet ?Commonly known as: LOPRESSOR ?Take 1 tablet (25 mg total) by mouth  2 (two) times daily. ?What changed: when to take this ?  ?multivitamin with minerals Tabs tablet ?Take 1 tablet by mouth daily. ?  ?phenazopyridine 100 MG tablet ?Commonly known as:  PYRIDIUM ?Take 1 tablet (100 mg total) by mouth 3 (three) times daily as needed for up to 5 days for pain. ?  ? ?  ? ? Follow-up Information   ? ? Curlene Labrum, MD. Schedule an appointment as soon as possible

## 2021-06-10 NOTE — Plan of Care (Signed)

## 2021-06-10 NOTE — Care Management Important Message (Signed)
Important Message ? ?Patient Details  ?Name: Kelli Rodriguez ?MRN: [id number] ?Date of Birth: October 21, 1934 ? ? ?Medicare Important Message Given:  N/A - LOS <3 / Initial given by admissions ? ? ? ? ?Tommy Medal ?06/10/2021, 2:16 PM ?

## 2021-06-11 LAB — URINE CULTURE: Culture: >=100000 — AB

## 2021-06-13 LAB — CULTURE, BLOOD (ROUTINE X 2)
Culture: NO GROWTH
Culture: NO GROWTH
Special Requests: ADEQUATE

## 2021-06-18 DIAGNOSIS — D72829 Elevated white blood cell count, unspecified: Secondary | ICD-10-CM | POA: Diagnosis not present

## 2021-06-18 DIAGNOSIS — A419 Sepsis, unspecified organism: Secondary | ICD-10-CM | POA: Diagnosis not present

## 2021-06-18 DIAGNOSIS — N12 Tubulo-interstitial nephritis, not specified as acute or chronic: Secondary | ICD-10-CM | POA: Diagnosis not present

## 2021-06-18 DIAGNOSIS — I10 Essential (primary) hypertension: Secondary | ICD-10-CM | POA: Diagnosis not present

## 2021-06-18 DIAGNOSIS — J9601 Acute respiratory failure with hypoxia: Secondary | ICD-10-CM | POA: Diagnosis not present

## 2021-06-18 DIAGNOSIS — R944 Abnormal results of kidney function studies: Secondary | ICD-10-CM | POA: Diagnosis not present

## 2021-06-18 DIAGNOSIS — I160 Hypertensive urgency: Secondary | ICD-10-CM | POA: Diagnosis not present

## 2021-06-18 DIAGNOSIS — E7849 Other hyperlipidemia: Secondary | ICD-10-CM | POA: Diagnosis not present

## 2021-06-18 DIAGNOSIS — E876 Hypokalemia: Secondary | ICD-10-CM | POA: Diagnosis not present

## 2021-06-18 DIAGNOSIS — E871 Hypo-osmolality and hyponatremia: Secondary | ICD-10-CM | POA: Diagnosis not present

## 2021-07-10 DIAGNOSIS — D72829 Elevated white blood cell count, unspecified: Secondary | ICD-10-CM | POA: Diagnosis not present

## 2021-07-10 DIAGNOSIS — I10 Essential (primary) hypertension: Secondary | ICD-10-CM | POA: Diagnosis not present

## 2021-07-10 DIAGNOSIS — E876 Hypokalemia: Secondary | ICD-10-CM | POA: Diagnosis not present

## 2021-07-10 DIAGNOSIS — R944 Abnormal results of kidney function studies: Secondary | ICD-10-CM | POA: Diagnosis not present

## 2021-07-10 DIAGNOSIS — J9601 Acute respiratory failure with hypoxia: Secondary | ICD-10-CM | POA: Diagnosis not present

## 2021-07-10 DIAGNOSIS — N12 Tubulo-interstitial nephritis, not specified as acute or chronic: Secondary | ICD-10-CM | POA: Diagnosis not present

## 2021-07-10 DIAGNOSIS — A419 Sepsis, unspecified organism: Secondary | ICD-10-CM | POA: Diagnosis not present

## 2021-07-10 DIAGNOSIS — I160 Hypertensive urgency: Secondary | ICD-10-CM | POA: Diagnosis not present

## 2021-08-17 ENCOUNTER — Other Ambulatory Visit (HOSPITAL_COMMUNITY): Payer: Self-pay | Admitting: Family Medicine

## 2021-08-17 DIAGNOSIS — Z1231 Encounter for screening mammogram for malignant neoplasm of breast: Secondary | ICD-10-CM

## 2021-08-17 DIAGNOSIS — Z13820 Encounter for screening for osteoporosis: Secondary | ICD-10-CM

## 2021-09-07 ENCOUNTER — Ambulatory Visit (HOSPITAL_COMMUNITY)
Admission: RE | Admit: 2021-09-07 | Discharge: 2021-09-07 | Disposition: A | Discharge status: Home / Self Care | Payer: Medicare Other | Source: Ambulatory Visit | Attending: Family Medicine | Admitting: Family Medicine

## 2021-09-07 DIAGNOSIS — M8589 Other specified disorders of bone density and structure, multiple sites: Secondary | ICD-10-CM | POA: Diagnosis not present

## 2021-09-07 DIAGNOSIS — Z13820 Encounter for screening for osteoporosis: Secondary | ICD-10-CM | POA: Insufficient documentation

## 2021-09-07 DIAGNOSIS — Z1231 Encounter for screening mammogram for malignant neoplasm of breast: Secondary | ICD-10-CM | POA: Diagnosis not present

## 2021-09-07 DIAGNOSIS — Z780 Asymptomatic menopausal state: Secondary | ICD-10-CM | POA: Insufficient documentation

## 2021-09-07 DIAGNOSIS — M85832 Other specified disorders of bone density and structure, left forearm: Secondary | ICD-10-CM | POA: Diagnosis not present

## 2021-09-07 DIAGNOSIS — Z853 Personal history of malignant neoplasm of breast: Secondary | ICD-10-CM | POA: Diagnosis not present

## 2021-09-07 DIAGNOSIS — M85851 Other specified disorders of bone density and structure, right thigh: Secondary | ICD-10-CM | POA: Diagnosis not present

## 2021-10-01 DIAGNOSIS — X32XXXD Exposure to sunlight, subsequent encounter: Secondary | ICD-10-CM | POA: Diagnosis not present

## 2021-10-01 DIAGNOSIS — L570 Actinic keratosis: Secondary | ICD-10-CM | POA: Diagnosis not present

## 2021-11-16 DIAGNOSIS — E871 Hypo-osmolality and hyponatremia: Secondary | ICD-10-CM | POA: Diagnosis not present

## 2021-11-16 DIAGNOSIS — E559 Vitamin D deficiency, unspecified: Secondary | ICD-10-CM | POA: Diagnosis not present

## 2021-11-16 DIAGNOSIS — E7849 Other hyperlipidemia: Secondary | ICD-10-CM | POA: Diagnosis not present

## 2021-11-16 DIAGNOSIS — I10 Essential (primary) hypertension: Secondary | ICD-10-CM | POA: Diagnosis not present

## 2021-11-16 DIAGNOSIS — Z1329 Encounter for screening for other suspected endocrine disorder: Secondary | ICD-10-CM | POA: Diagnosis not present

## 2021-11-16 DIAGNOSIS — Z13220 Encounter for screening for lipoid disorders: Secondary | ICD-10-CM | POA: Diagnosis not present

## 2021-11-16 DIAGNOSIS — E876 Hypokalemia: Secondary | ICD-10-CM | POA: Diagnosis not present

## 2021-11-19 DIAGNOSIS — E876 Hypokalemia: Secondary | ICD-10-CM | POA: Diagnosis not present

## 2021-11-19 DIAGNOSIS — I10 Essential (primary) hypertension: Secondary | ICD-10-CM | POA: Diagnosis not present

## 2021-11-19 DIAGNOSIS — M8580 Other specified disorders of bone density and structure, unspecified site: Secondary | ICD-10-CM | POA: Diagnosis not present

## 2021-11-19 DIAGNOSIS — E7849 Other hyperlipidemia: Secondary | ICD-10-CM | POA: Diagnosis not present

## 2021-11-19 DIAGNOSIS — E559 Vitamin D deficiency, unspecified: Secondary | ICD-10-CM | POA: Diagnosis not present

## 2021-11-19 DIAGNOSIS — E871 Hypo-osmolality and hyponatremia: Secondary | ICD-10-CM | POA: Diagnosis not present

## 2021-11-19 DIAGNOSIS — R944 Abnormal results of kidney function studies: Secondary | ICD-10-CM | POA: Diagnosis not present

## 2022-01-15 ENCOUNTER — Emergency Department (HOSPITAL_COMMUNITY): Payer: Medicare Other

## 2022-01-15 ENCOUNTER — Encounter (HOSPITAL_COMMUNITY): Payer: Self-pay | Admitting: *Deleted

## 2022-01-15 ENCOUNTER — Other Ambulatory Visit: Payer: Self-pay

## 2022-01-15 ENCOUNTER — Emergency Department (HOSPITAL_COMMUNITY)
Admission: EM | Admit: 2022-01-15 | Discharge: 2022-01-16 | Disposition: A | Discharge status: Home / Self Care | Payer: Medicare Other | Attending: Emergency Medicine | Admitting: Emergency Medicine

## 2022-01-15 DIAGNOSIS — I10 Essential (primary) hypertension: Secondary | ICD-10-CM | POA: Insufficient documentation

## 2022-01-15 DIAGNOSIS — R270 Ataxia, unspecified: Secondary | ICD-10-CM | POA: Diagnosis not present

## 2022-01-15 DIAGNOSIS — R42 Dizziness and giddiness: Secondary | ICD-10-CM

## 2022-01-15 DIAGNOSIS — I159 Secondary hypertension, unspecified: Secondary | ICD-10-CM

## 2022-01-15 DIAGNOSIS — R519 Headache, unspecified: Secondary | ICD-10-CM | POA: Diagnosis not present

## 2022-01-15 LAB — BASIC METABOLIC PANEL
Anion gap: 7 (ref 5–15)
BUN: 13 mg/dL (ref 8–23)
CO2: 28 mmol/L (ref 22–32)
Calcium: 9.2 mg/dL (ref 8.9–10.3)
Chloride: 102 mmol/L (ref 98–111)
Creatinine, Ser: 0.74 mg/dL (ref 0.44–1.00)
GFR, Estimated: >60 mL/min (ref >60)
Glucose, Bld: 105 mg/dL — ABNORMAL HIGH (ref 70–99)
Potassium: 4.4 mmol/L (ref 3.5–5.1)
Sodium: 137 mmol/L (ref 135–145)

## 2022-01-15 LAB — CBC WITH DIFFERENTIAL/PLATELET
Abs Immature Granulocytes: 0.02 10*3/uL (ref 0.00–0.07)
Basophils Absolute: 0 10*3/uL (ref 0.0–0.1)
Basophils Relative: 0 %
Eosinophils Absolute: 0.1 10*3/uL (ref 0.0–0.5)
Eosinophils Relative: 1 %
HCT: 39 % (ref 36.0–46.0)
Hemoglobin: 13.2 g/dL (ref 12.0–15.0)
Immature Granulocytes: 0 %
Lymphocytes Relative: 30 %
Lymphs Abs: 2.7 10*3/uL (ref 0.7–4.0)
MCH: 32 pg (ref 26.0–34.0)
MCHC: 33.8 g/dL (ref 30.0–36.0)
MCV: 94.7 fL (ref 80.0–100.0)
Monocytes Absolute: 0.5 10*3/uL (ref 0.1–1.0)
Monocytes Relative: 6 %
Neutro Abs: 5.5 10*3/uL (ref 1.7–7.7)
Neutrophils Relative %: 63 %
Platelets: 328 10*3/uL (ref 150–400)
RBC: 4.12 MIL/uL (ref 3.87–5.11)
RDW: 11.3 % — ABNORMAL LOW (ref 11.5–15.5)
WBC: 8.8 10*3/uL (ref 4.0–10.5)
nRBC: 0 % (ref 0.0–0.2)

## 2022-01-15 MED ORDER — HYDRALAZINE HCL 25 MG PO TABS
25.0000 mg | ORAL_TABLET | Freq: Once | ORAL | Status: AC
Start: 2022-01-15 — End: 2022-01-15
  Administered 2022-01-15: 25 mg via ORAL
  Filled 2022-01-15: qty 1

## 2022-01-15 MED ORDER — MECLIZINE HCL 12.5 MG PO TABS
12.5000 mg | ORAL_TABLET | ORAL | Status: AC
  Administered 2022-01-15: 12.5 mg via ORAL
  Filled 2022-01-15: qty 1

## 2022-01-15 MED ORDER — ACETAMINOPHEN 500 MG PO TABS
1000.0000 mg | ORAL_TABLET | Freq: Once | ORAL | Status: AC
  Administered 2022-01-15: 1000 mg via ORAL
  Filled 2022-01-15: qty 2

## 2022-01-15 NOTE — ED Triage Notes (Signed)
Pt arrived via POV with son, transferred from [facility], awaiting an MRI

## 2022-01-15 NOTE — ED Provider Notes (Signed)
[facility] EMERGENCY DEPARTMENT Provider Note   CSN: [id number] Arrival date & time: 01/15/22  1557     History  Chief Complaint  Patient presents with   Hypertension    Kelli Rodriguez is a 87 y.o. female.  87 year old female with a history of hypertension and hyperlipidemia who presents to the emergency department with headache and dizziness and hypertension at home.  Patient states that over the past week she has had intermittent dizziness.  Says it will come on and last for approximately an hour.  Says it is worse with walking as well as standing up.  Says that it feels as though the room is spinning and has not had any fainting or lightheadedness with it.  Says it is not triggered by turning her head.  Denies any hearing loss or tinnitus.  Took her blood pressure at home and it was 200/95 with her and her son's home blood pressure cuffs.  They decided to come into the emergency department for evaluation.  Says that she has been compliant with her home antihypertensives.  Did develop a mild headache since being in the waiting room.  Denies any chest pain or shortness of breath or palpitations.  No double vision, difficulty speaking or swallowing, numbness or weakness of her arms or legs.  No history of stroke.      Home Medications Prior to Admission medications   Medication Sig Start Date End Date Taking? Authorizing Provider  acetaminophen (TYLENOL) 325 MG tablet Take 2 tablets (650 mg total) by mouth every 6 (six) hours as needed for mild pain, moderate pain or fever. 06/10/21   Barton Dubois, MD  atorvastatin (LIPITOR) 20 MG tablet Take 20 mg by mouth daily. 02/11/20   [provider]  BIOTIN PO Take 1 tablet by mouth daily.    [provider]  Calcium Carbonate-Vit D-Min (CALCIUM 1200 PO) Take 1 tablet by mouth daily.    [provider]  cefdinir (OMNICEF) 300 MG capsule Take 1 capsule (300 mg total) by mouth 2 (two) times daily. 06/10/21   Barton Dubois, MD  diclofenac Sodium (VOLTAREN) 1 % GEL Apply 1-2 g topically 4 (four) times daily as needed (pain). 05/21/21   [provider]  hydrochlorothiazide (HYDRODIURIL) 25 MG tablet Take 0.5 tablets (12.5 mg total) by mouth daily. 06/12/21   Barton Dubois, MD  losartan (COZAAR) 100 MG tablet Take 1 tablet (100 mg total) by mouth daily with breakfast. 07/15/15   Timmothy Euler, MD  MAGNESIUM PO Take 1 tablet by mouth daily.    [provider]  metoprolol tartrate (LOPRESSOR) 25 MG tablet Take 1 tablet (25 mg total) by mouth 2 (two) times daily. 06/10/21   Barton Dubois, MD  Multiple Vitamin (MULTIVITAMIN WITH MINERALS) TABS Take 1 tablet by mouth daily.    [provider]      Allergies    Patient has no known allergies.    Review of Systems   Review of Systems  Physical Exam Updated Vital Signs BP (!) 188/77   Pulse 87   Temp 98 F (36.7 C) (Oral)   Resp 17   Ht 5' 4.5" (1.638 m)   Wt 83.5 kg   SpO2 93%   BMI 31.10 kg/m  Physical Exam Vitals and nursing note reviewed.  Constitutional:      General: She is not in acute distress.    Appearance: She is well-developed.  HENT:     Head: Normocephalic and atraumatic.  Right Ear: External ear normal.     Left Ear: External ear normal.     Nose: Nose normal.  Eyes:     Extraocular Movements: Extraocular movements intact.     Conjunctiva/sclera: Conjunctivae normal.     Pupils: Pupils are equal, round, and reactive to light.  Cardiovascular:     Rate and Rhythm: Normal rate and regular rhythm.     Heart sounds: No murmur heard. Pulmonary:     Effort: Pulmonary effort is normal. No respiratory distress.     Breath sounds: Normal breath sounds.  Musculoskeletal:     Cervical back: Normal range of motion and neck supple.     Right lower leg: No edema.     Left lower leg: No edema.  Skin:    General: Skin is warm and dry.  Neurological:     Mental Status: She is alert.     Comments: MENTAL  STATUS: AAOx3 CRANIAL NERVES: II: Pupils equal and reactive 3 mm BL, no RAPD, no VF deficits III, IV, VI: EOM intact, no gaze preference or deviation, no nystagmus. V: normal sensation to light touch in V1, V2, and V3 segments bilaterally VII: no facial weakness or asymmetry, no nasolabial fold flattening VIII: normal hearing to speech and finger friction IX, X: normal palatal elevation, no uvular deviation XI: 5/5 head turn and 5/5 shoulder shrug bilaterally XII: midline tongue protrusion MOTOR: 5/5 strength in R shoulder flexion, elbow flexion and extension, and grip strength. 5/5 strength in L shoulder flexion, elbow flexion and extension, and grip strength.  5/5 strength in R hip and knee flexion, knee extension, ankle plantar and dorsiflexion. 5/5 strength in L hip and knee flexion, knee extension, ankle plantar and dorsiflexion. SENSORY: Normal sensation to light touch in all extremities COORD: Normal finger to nose and heel to shin, no tremor, no dysmetria STATION: normal stance, no truncal ataxia GAIT: mild ataxia   Psychiatric:        Mood and Affect: Mood normal.     ED Results / Procedures / Treatments   Labs (all labs ordered are listed, but only abnormal results are displayed) Labs Reviewed  CBC WITH DIFFERENTIAL/PLATELET - Abnormal; Notable for the following components:      Result Value   RDW 11.3 (*)    All other components within normal limits  BASIC METABOLIC PANEL - Abnormal; Notable for the following components:   Glucose, Bld 105 (*)    All other components within normal limits    EKG EKG Interpretation  Date/Time:  Friday January 15 2022 16:32:45 EST Ventricular Rate:  65 PR Interval:  190 QRS Duration: 92 QT Interval:  402 QTC Calculation: 418 R Axis:   25 Text Interpretation: Normal sinus rhythm Cannot rule out Anterior infarct , age undetermined Abnormal ECG When compared to ekg from 01/24/2012 no significant changes Confirmed by Margaretmary Eddy [id number]) on 01/15/2022 6:52:20 PM  Radiology CT Head Wo Contrast  Result Date: 01/15/2022 CLINICAL DATA:  Headache, dizziness, hypertension EXAM: CT HEAD WITHOUT CONTRAST TECHNIQUE: Contiguous axial images were obtained from the base of the skull through the vertex without intravenous contrast. RADIATION DOSE REDUCTION: This exam was performed according to the departmental dose-optimization program which includes automated exposure control, adjustment of the mA and/or kV according to patient size and/or use of iterative reconstruction technique. COMPARISON:  None Available. FINDINGS: Brain: No evidence of acute infarction, hemorrhage, hydrocephalus, extra-axial collection or mass lesion/mass effect. Vascular: No hyperdense vessel or unexpected calcification. Skull: Normal. Negative  for fracture or focal lesion. Sinuses/Orbits: The visualized paranasal sinuses are essentially clear. The mastoid air cells are unopacified. Other: None. IMPRESSION: Normal head CT. Electronically Signed   By: Julian Hy M.D.   On: 01/15/2022 19:36    Procedures Procedures   Medications Ordered in ED Medications  acetaminophen (TYLENOL) tablet 1,000 mg (1,000 mg Oral Given 01/15/22 2012)  hydrALAZINE (APRESOLINE) tablet 25 mg (25 mg Oral Given 01/15/22 2012)  meclizine (ANTIVERT) tablet 12.5 mg (12.5 mg Oral Given 01/15/22 2108)    ED Course/ Medical Decision Making/ A&P Clinical Course as of 01/15/22 2202  Fri Jan 15, 2022  2102 Dr Gilford Raid has accepted for ED to ED transfer.  [RP]    Clinical Course User Index [RP] Fransico Meadow, MD                           Medical Decision Making Amount and/or Complexity of Data Reviewed Labs: ordered. Radiology: ordered.  Risk OTC drugs. Prescription drug management.   AVAYA MCJUNKINS is a 87 y.o. female with comorbidities that complicate the patient evaluation including HTN and HLD who presents with intermittent dizziness, headache, and HTN.     Initial Ddx:  ICH, HTN emergency, stroke, orthostasis, migraine  MDM:  With the patient's elevated blood pressure and age with headache and dizziness will obtain CT of the head to evaluate for ICH.  Her headache is very mild and with stroke on the differential do not feel that she needs to be treated as hypertensive emergency with IV antihypertensives as this could worsen an ischemic event.  Will give small dose of p.o. hydralazine since she is above the limits for permissive hypertension for ischemic stroke at this time.  Her dizziness and exam do not fit completely with vertigo or orthostasis.  Given her risk factors feel that she will need MRI to evaluate for possible stroke.  Considered cardiac arrhythmia but since the patient is not having palpitations, chest pain, shortness of breath feel that this is less likely the cause of her dizziness.  Plan:  Labs P.o. hydralazine Tylenol EKG CT head without contrast  ED Summary/Re-evaluation:  Patient underwent the above workup and had improvement of her blood pressure after the hydralazine on repeat evaluation.  Says that her headache had nearly resolved after the Tylenol.  CT of the head did not reveal any acute abnormalities.  Did discuss with Dr. Gilford Raid from [facility] who has accepted the patient for MRI.  If MRI is unremarkable patient will require repeat evaluation of her blood pressure and likely will need to be started on another agent for her blood pressure at home.  Her son is taking her to [facility] by POV.  This patient presents to the ED for concern of complaints listed in HPI, this involves an extensive number of treatment options, and is a complaint that carries with it a high risk of complications and morbidity. Disposition including potential need for admission considered.   Dispo: Transfer to [facility] ED  Additional history obtained from son Records reviewed Outpatient Clinic Notes The following labs were independently  interpreted: Chemistry and show no acute abnormality I independently reviewed the following imaging with scope of interpretation limited to determining acute life threatening conditions related to emergency care: CT Head, which revealed no acute abnormality  I personally reviewed and interpreted cardiac monitoring: normal sinus rhythm  I personally reviewed and interpreted the pt's EKG: see above for interpretation  I have reviewed the patients home medications and made adjustments as needed   Final Clinical Impression(s) / ED Diagnoses Final diagnoses:  Secondary hypertension  Dizziness    Rx / DC Orders ED Discharge Orders     None         Fransico Meadow, MD 01/15/22 2202

## 2022-01-15 NOTE — ED Notes (Signed)
Patient transported to CT 

## 2022-01-15 NOTE — ED Triage Notes (Signed)
Pt sent by her PCP for her HTN, pt seen at [facility]. Pt starting to have a mild HA. + dizziness

## 2022-01-15 NOTE — ED Notes (Signed)
Patient transported to MRI 

## 2022-01-16 DIAGNOSIS — R270 Ataxia, unspecified: Secondary | ICD-10-CM | POA: Diagnosis not present

## 2022-01-16 DIAGNOSIS — R519 Headache, unspecified: Secondary | ICD-10-CM | POA: Diagnosis not present

## 2022-01-16 MED ORDER — IBUPROFEN 400 MG PO TABS
400.0000 mg | ORAL_TABLET | Freq: Once | ORAL | Status: AC
  Administered 2022-01-16: 400 mg via ORAL
  Filled 2022-01-16: qty 1

## 2022-01-16 MED ORDER — AMLODIPINE BESYLATE 5 MG PO TABS
5.0000 mg | ORAL_TABLET | Freq: Once | ORAL | Status: AC
  Administered 2022-01-16: 5 mg via ORAL
  Filled 2022-01-16: qty 1

## 2022-01-16 MED ORDER — AMLODIPINE BESYLATE 5 MG PO TABS: 5.0000 mg | ORAL_TABLET | Freq: Every day | ORAL | 0 refills | Status: AC

## 2022-01-16 NOTE — ED Provider Notes (Signed)
Patient here from [facility] for MRI  MRI without acute or subacute findings.    NCAT EOMI Intact speech RRR CTAB NABS Gait intact  Results for orders placed or performed during the hospital encounter of 01/15/22  CBC with Differential  Result Value Ref Range   WBC 8.8 4.0 - 10.5 K/uL   RBC 4.12 3.87 - 5.11 MIL/uL   Hemoglobin 13.2 12.0 - 15.0 g/dL   HCT 39.0 36.0 - 46.0 %   MCV 94.7 80.0 - 100.0 fL   MCH 32.0 26.0 - 34.0 pg   MCHC 33.8 30.0 - 36.0 g/dL   RDW 11.3 (L) 11.5 - 15.5 %   Platelets 328 150 - 400 K/uL   nRBC 0.0 0.0 - 0.2 %   Neutrophils Relative % 63 %   Neutro Abs 5.5 1.7 - 7.7 K/uL   Lymphocytes Relative 30 %   Lymphs Abs 2.7 0.7 - 4.0 K/uL   Monocytes Relative 6 %   Monocytes Absolute 0.5 0.1 - 1.0 K/uL   Eosinophils Relative 1 %   Eosinophils Absolute 0.1 0.0 - 0.5 K/uL   Basophils Relative 0 %   Basophils Absolute 0.0 0.0 - 0.1 K/uL   Immature Granulocytes 0 %   Abs Immature Granulocytes 0.02 0.00 - 0.07 K/uL  Basic metabolic panel  Result Value Ref Range   Sodium 137 135 - 145 mmol/L   Potassium 4.4 3.5 - 5.1 mmol/L   Chloride 102 98 - 111 mmol/L   CO2 28 22 - 32 mmol/L   Glucose, Bld 105 (H) 70 - 99 mg/dL   BUN 13 8 - 23 mg/dL   Creatinine, Ser 0.74 0.44 - 1.00 mg/dL   Calcium 9.2 8.9 - 10.3 mg/dL   GFR, Estimated >60 >60 mL/min   Anion gap 7 5 - 15   MR BRAIN WO CONTRAST  Result Date: 01/16/2022 CLINICAL DATA:  Headache and ataxia EXAM: MRI HEAD WITHOUT CONTRAST TECHNIQUE: Multiplanar, multiecho pulse sequences of the brain and surrounding structures were obtained without intravenous contrast. COMPARISON:  None Available. FINDINGS: Brain: No acute infarct, mass effect or extra-axial collection. No acute or chronic hemorrhage. There is multifocal hyperintense T2-weighted signal within the white matter. Parenchymal volume and CSF spaces are normal. The midline structures are normal. Vascular: Major flow voids are preserved. Skull and upper cervical  spine: Normal calvarium and skull base. Visualized upper cervical spine and soft tissues are normal. Sinuses/Orbits:No paranasal sinus fluid levels or advanced mucosal thickening. No mastoid or middle ear effusion. Normal orbits. IMPRESSION: 1. No acute intracranial abnormality. 2. Findings of chronic small vessel ischemia. Electronically Signed   By: Ulyses Jarred M.D.   On: 01/16/2022 00:15   CT Head Wo Contrast  Result Date: 01/15/2022 CLINICAL DATA:  Headache, dizziness, hypertension EXAM: CT HEAD WITHOUT CONTRAST TECHNIQUE: Contiguous axial images were obtained from the base of the skull through the vertex without intravenous contrast. RADIATION DOSE REDUCTION: This exam was performed according to the departmental dose-optimization program which includes automated exposure control, adjustment of the mA and/or kV according to patient size and/or use of iterative reconstruction technique. COMPARISON:  None Available. FINDINGS: Brain: No evidence of acute infarction, hemorrhage, hydrocephalus, extra-axial collection or mass lesion/mass effect. Vascular: No hyperdense vessel or unexpected calcification. Skull: Normal. Negative for fracture or focal lesion. Sinuses/Orbits: The visualized paranasal sinuses are essentially clear. The mastoid air cells are unopacified. Other: None. IMPRESSION: Normal head CT. Electronically Signed   By: Julian Hy M.D.   On: 01/15/2022 19:36  Norvasc initiated follow up with PMD    Charrisse Masley, MD 01/16/22 7703

## 2022-01-16 NOTE — ED Notes (Signed)
Patient verbalizes understanding of discharge instructions. Opportunity for questioning and answers were provided. Armband removed by staff, pt discharged from ED. Ambulated out to lobby with son

## 2022-01-28 ENCOUNTER — Other Ambulatory Visit: Payer: Self-pay | Admitting: Family Medicine

## 2022-01-28 DIAGNOSIS — I7121 Aneurysm of the ascending aorta, without rupture: Secondary | ICD-10-CM

## 2022-01-28 DIAGNOSIS — E7849 Other hyperlipidemia: Secondary | ICD-10-CM | POA: Diagnosis not present

## 2022-01-28 DIAGNOSIS — I10 Essential (primary) hypertension: Secondary | ICD-10-CM | POA: Diagnosis not present

## 2022-01-28 DIAGNOSIS — I160 Hypertensive urgency: Secondary | ICD-10-CM | POA: Diagnosis not present

## 2022-02-08 ENCOUNTER — Encounter: Payer: Self-pay | Admitting: Orthopedic Surgery

## 2022-02-24 DIAGNOSIS — I7121 Aneurysm of the ascending aorta, without rupture: Secondary | ICD-10-CM | POA: Diagnosis not present

## 2022-02-24 DIAGNOSIS — R110 Nausea: Secondary | ICD-10-CM | POA: Diagnosis not present

## 2022-02-24 DIAGNOSIS — R1031 Right lower quadrant pain: Secondary | ICD-10-CM | POA: Diagnosis not present

## 2022-02-24 DIAGNOSIS — I10 Essential (primary) hypertension: Secondary | ICD-10-CM | POA: Diagnosis not present

## 2022-02-24 DIAGNOSIS — D171 Benign lipomatous neoplasm of skin and subcutaneous tissue of trunk: Secondary | ICD-10-CM | POA: Diagnosis not present

## 2022-02-24 DIAGNOSIS — Z853 Personal history of malignant neoplasm of breast: Secondary | ICD-10-CM | POA: Diagnosis not present

## 2022-02-25 ENCOUNTER — Ambulatory Visit
Admission: RE | Admit: 2022-02-25 | Discharge: 2022-02-25 | Disposition: A | Discharge status: Home / Self Care | Payer: 59 | Source: Ambulatory Visit | Attending: Family Medicine | Admitting: Family Medicine

## 2022-02-25 DIAGNOSIS — I7121 Aneurysm of the ascending aorta, without rupture: Secondary | ICD-10-CM | POA: Diagnosis not present

## 2022-02-25 MED ORDER — GADOPICLENOL 0.5 MMOL/ML IV SOLN
9.0000 mL | Freq: Once | INTRAVENOUS | Status: AC | PRN
  Administered 2022-02-25: 9 mL via INTRAVENOUS

## 2022-05-19 DIAGNOSIS — I10 Essential (primary) hypertension: Secondary | ICD-10-CM | POA: Diagnosis not present

## 2022-05-19 DIAGNOSIS — I7121 Aneurysm of the ascending aorta, without rupture: Secondary | ICD-10-CM | POA: Diagnosis not present

## 2022-05-25 DIAGNOSIS — I7121 Aneurysm of the ascending aorta, without rupture: Secondary | ICD-10-CM | POA: Diagnosis not present

## 2022-05-25 DIAGNOSIS — D72829 Elevated white blood cell count, unspecified: Secondary | ICD-10-CM | POA: Diagnosis not present

## 2022-05-25 DIAGNOSIS — E876 Hypokalemia: Secondary | ICD-10-CM | POA: Diagnosis not present

## 2022-05-25 DIAGNOSIS — Z853 Personal history of malignant neoplasm of breast: Secondary | ICD-10-CM | POA: Diagnosis not present

## 2022-05-25 DIAGNOSIS — R944 Abnormal results of kidney function studies: Secondary | ICD-10-CM | POA: Diagnosis not present

## 2022-05-25 DIAGNOSIS — I10 Essential (primary) hypertension: Secondary | ICD-10-CM | POA: Diagnosis not present

## 2022-05-25 DIAGNOSIS — E871 Hypo-osmolality and hyponatremia: Secondary | ICD-10-CM | POA: Diagnosis not present

## 2022-05-25 DIAGNOSIS — E7849 Other hyperlipidemia: Secondary | ICD-10-CM | POA: Diagnosis not present

## 2022-05-25 DIAGNOSIS — M8580 Other specified disorders of bone density and structure, unspecified site: Secondary | ICD-10-CM | POA: Diagnosis not present

## 2022-05-25 DIAGNOSIS — Z0001 Encounter for general adult medical examination with abnormal findings: Secondary | ICD-10-CM | POA: Diagnosis not present

## 2022-07-13 DIAGNOSIS — I10 Essential (primary) hypertension: Secondary | ICD-10-CM | POA: Diagnosis not present

## 2022-07-13 DIAGNOSIS — E871 Hypo-osmolality and hyponatremia: Secondary | ICD-10-CM | POA: Diagnosis not present

## 2022-07-13 DIAGNOSIS — R42 Dizziness and giddiness: Secondary | ICD-10-CM | POA: Diagnosis not present

## 2022-07-13 DIAGNOSIS — E876 Hypokalemia: Secondary | ICD-10-CM | POA: Diagnosis not present

## 2022-07-13 DIAGNOSIS — R5383 Other fatigue: Secondary | ICD-10-CM | POA: Diagnosis not present

## 2022-08-24 ENCOUNTER — Other Ambulatory Visit (HOSPITAL_COMMUNITY): Payer: Self-pay | Admitting: Family Medicine

## 2022-08-24 DIAGNOSIS — Z1231 Encounter for screening mammogram for malignant neoplasm of breast: Secondary | ICD-10-CM

## 2022-09-10 ENCOUNTER — Other Ambulatory Visit (HOSPITAL_COMMUNITY): Payer: Self-pay | Admitting: Family Medicine

## 2022-09-10 ENCOUNTER — Ambulatory Visit (HOSPITAL_COMMUNITY)
Admission: RE | Admit: 2022-09-10 | Discharge: 2022-09-10 | Disposition: A | Discharge status: Home / Self Care | Payer: 59 | Source: Ambulatory Visit | Attending: Family Medicine | Admitting: Family Medicine

## 2022-09-10 DIAGNOSIS — Z1231 Encounter for screening mammogram for malignant neoplasm of breast: Secondary | ICD-10-CM

## 2022-09-30 DIAGNOSIS — R58 Hemorrhage, not elsewhere classified: Secondary | ICD-10-CM | POA: Diagnosis not present

## 2022-09-30 DIAGNOSIS — L568 Other specified acute skin changes due to ultraviolet radiation: Secondary | ICD-10-CM | POA: Diagnosis not present

## 2022-09-30 DIAGNOSIS — L820 Inflamed seborrheic keratosis: Secondary | ICD-10-CM | POA: Diagnosis not present

## 2022-09-30 DIAGNOSIS — D485 Neoplasm of uncertain behavior of skin: Secondary | ICD-10-CM | POA: Diagnosis not present

## 2022-09-30 DIAGNOSIS — L538 Other specified erythematous conditions: Secondary | ICD-10-CM | POA: Diagnosis not present

## 2022-09-30 DIAGNOSIS — X32XXXA Exposure to sunlight, initial encounter: Secondary | ICD-10-CM | POA: Diagnosis not present

## 2022-09-30 DIAGNOSIS — R208 Other disturbances of skin sensation: Secondary | ICD-10-CM | POA: Diagnosis not present

## 2022-09-30 IMAGING — DX DG CHEST 1V PORT
1 series · 1 of 1 positions shown · non-contrast
Comparison: 02/13/2016

CLINICAL DATA: Dyspnea

EXAM:
PORTABLE CHEST 1 VIEW

[chest ap]
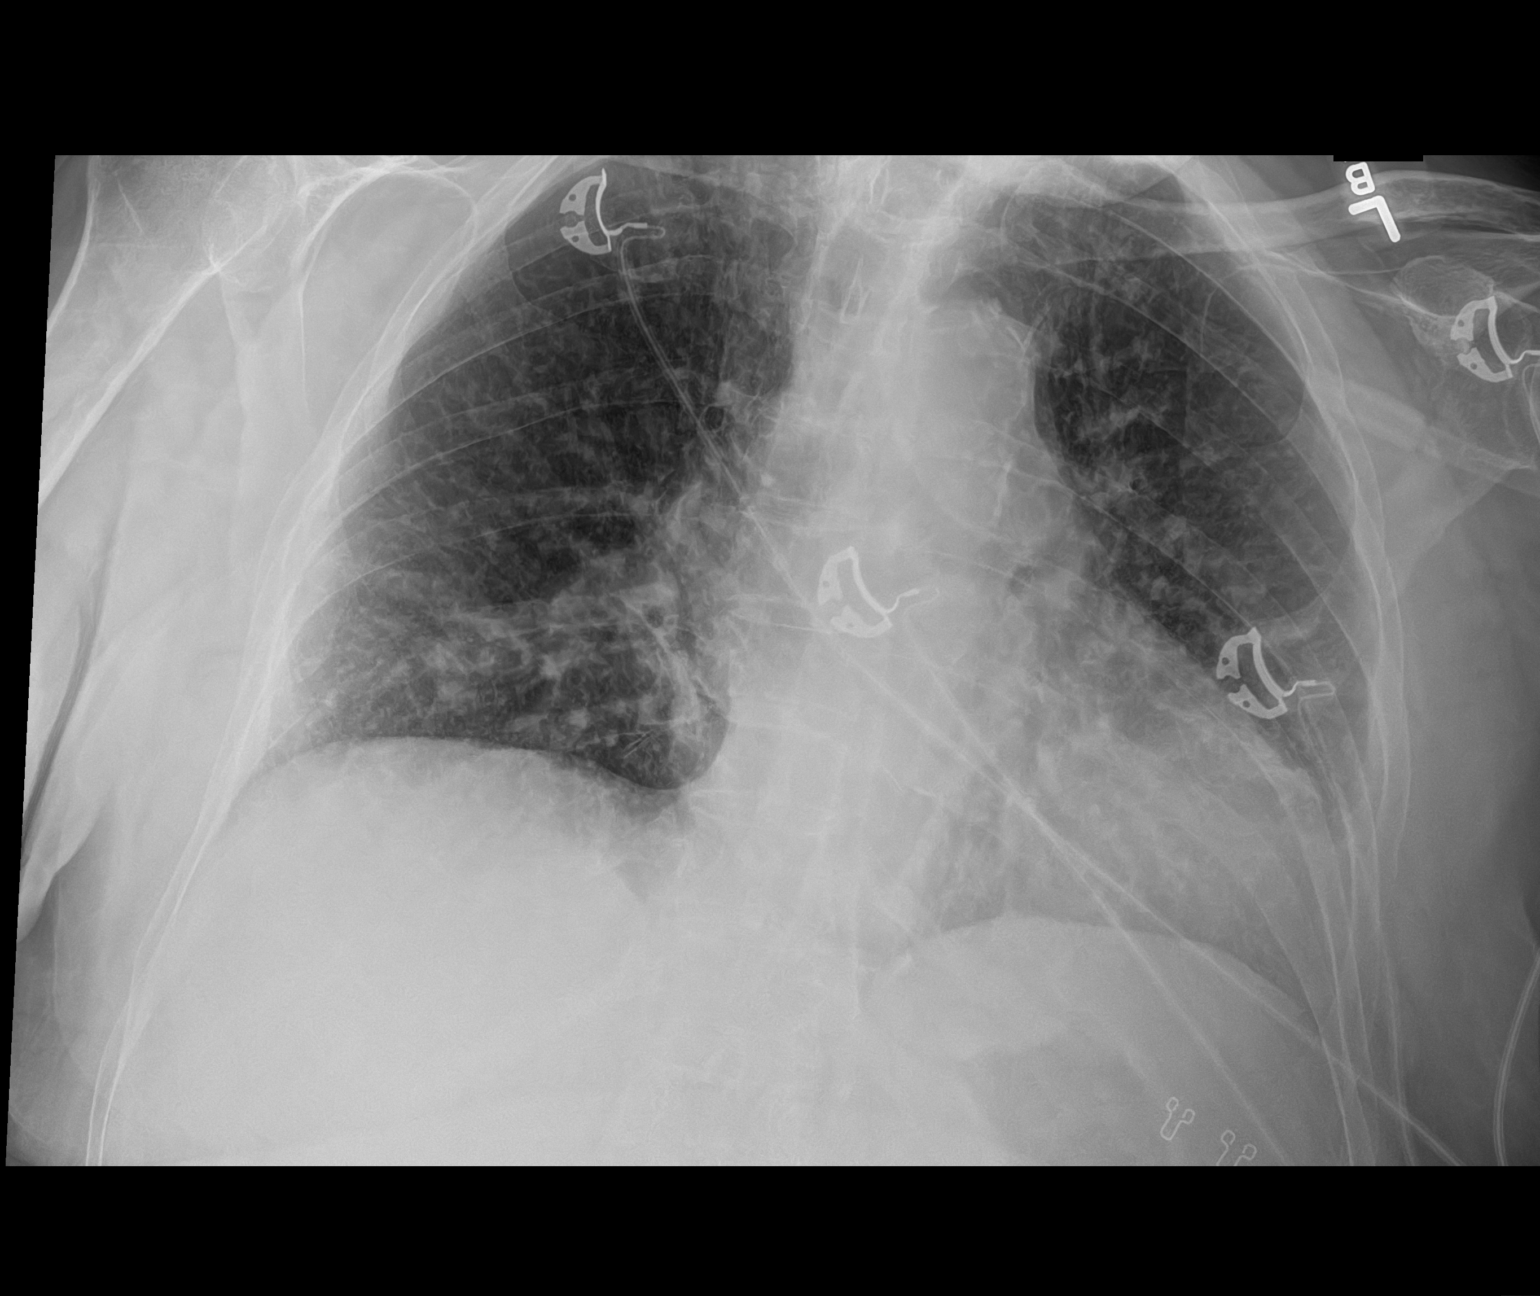

[1 of 1 positions shown; findings below may reference images not displayed]

FINDINGS: Lung volumes are small and pulmonary insufflation has diminished
since prior examination. Interstitial thickening has developed which
may relate to poor pulmonary insufflation. Superimposed airway
inflammation, however, may appear similarly. No pneumothorax or
pleural effusion. Cardiac size is within normal limits when
accounting for poor pulmonary insufflation. No acute bone
abnormality.
IMPRESSION: Pulmonary hypoinflation.

Mild interstitial thickening, possibly artifactual. This could be
better assessed with a standard two view chest radiograph, if
indicated.

## 2022-09-30 IMAGING — CT CT ABD-PELV W/ CM
2 of 5 series · 15 of 46 positions shown, 17 images · IV contrast (Omnipaque or Isovue)
Comparison: No prior abdominal imaging available.

CLINICAL DATA: Abdominal pain, post-op Flank pain, kidney stone
suspected Nausea/vomiting

EXAM:
CT ABDOMEN AND PELVIS WITH CONTRAST
TECHNIQUE: Multidetector CT imaging of the abdomen and pelvis was performed
using the standard protocol following bolus administration of
intravenous contrast.

[Series 2: axial st · axial · 0.88mm/px · z∈[+833,+1208]mm · 12 of 87 slices shown, 14 images]
[im 6/87  soft-tissue]
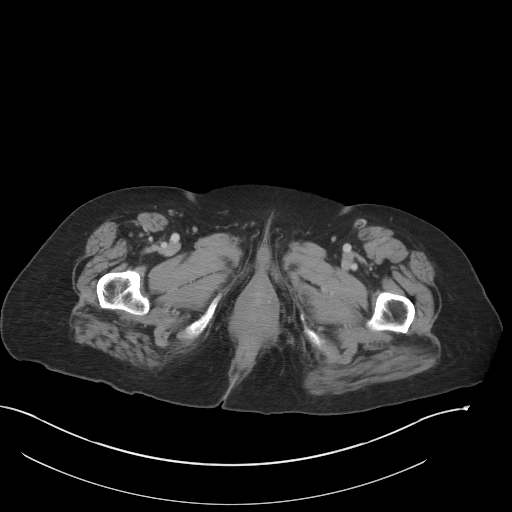
[im 6/87  bone]
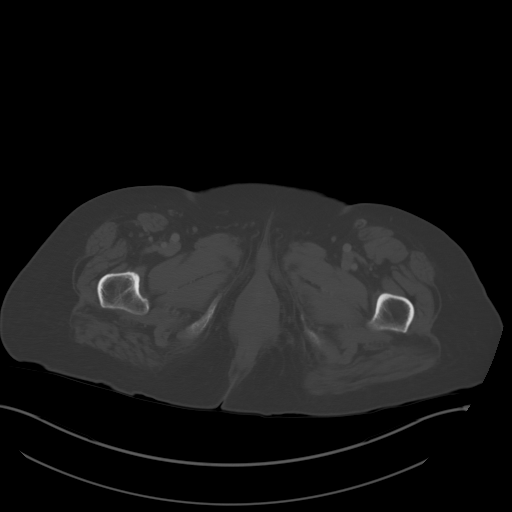
[im 16/87  soft-tissue]
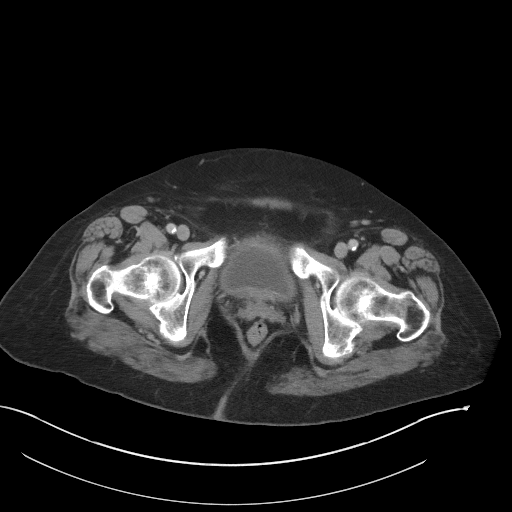
[im 21/87  soft-tissue]
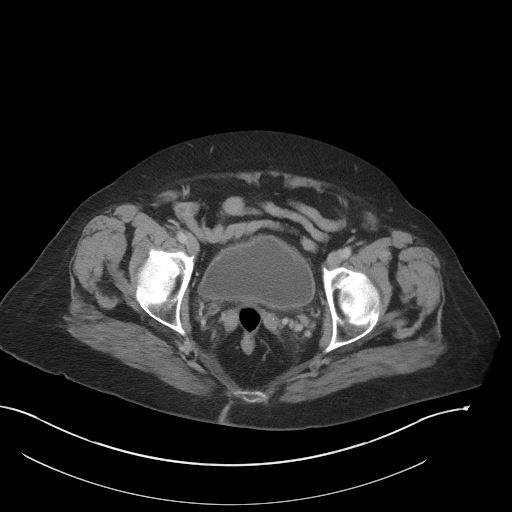
[im 26/87  soft-tissue]
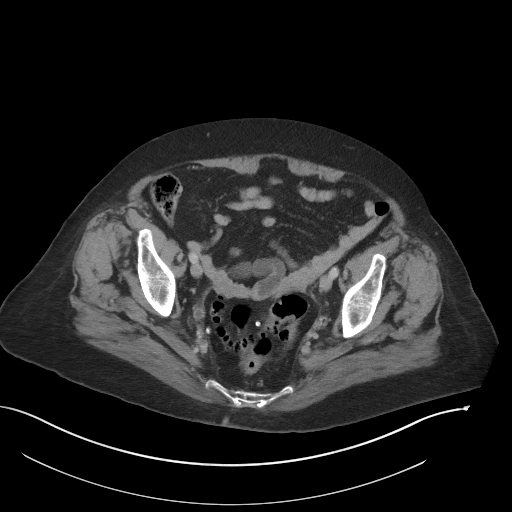
[im 36/87  soft-tissue]
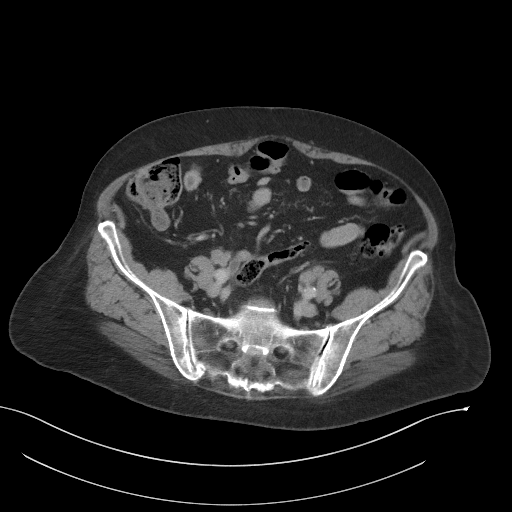
[im 41/87  soft-tissue]
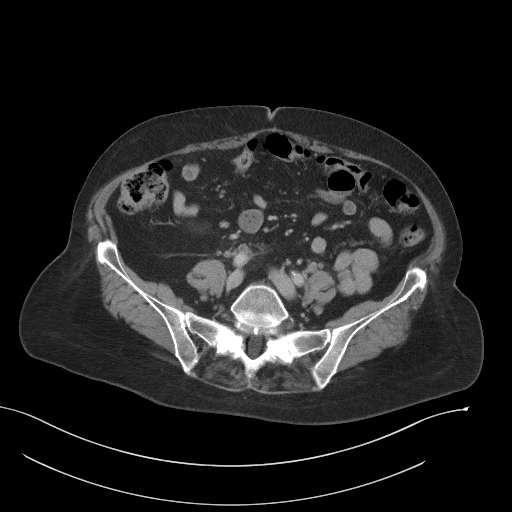
[im 46/87  soft-tissue]
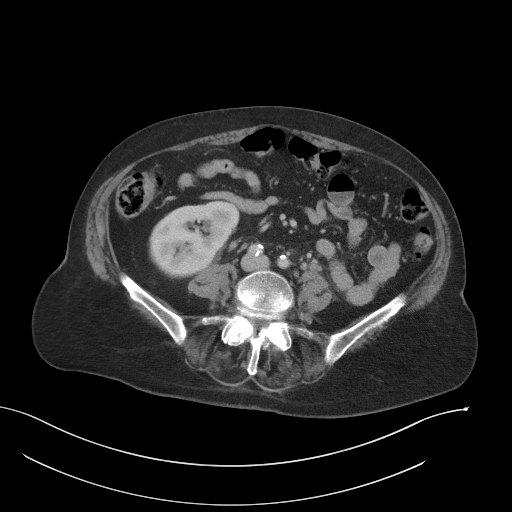
[im 56/87  soft-tissue]
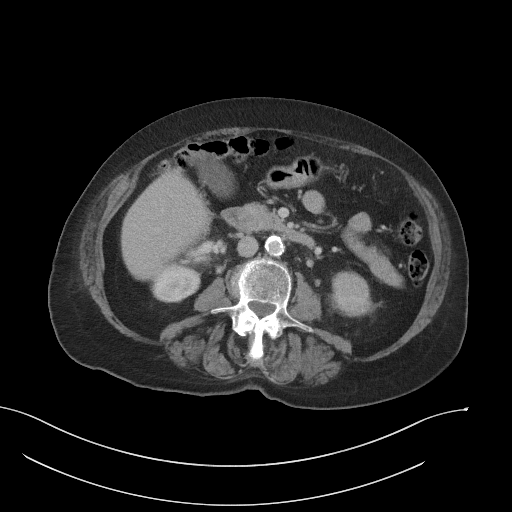
[im 61/87  soft-tissue]
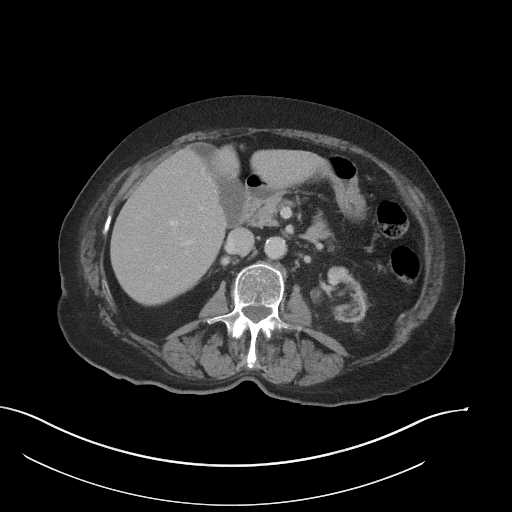
[im 61/87  bone]
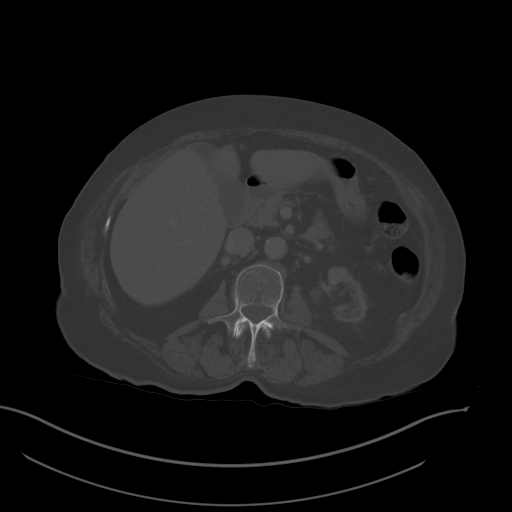
[im 66/87  soft-tissue]
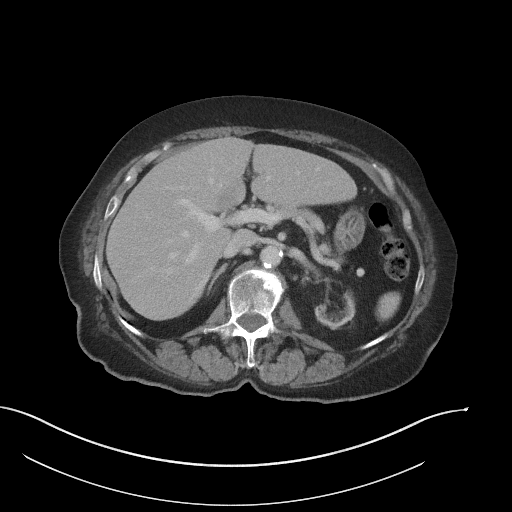
[im 76/87  soft-tissue]
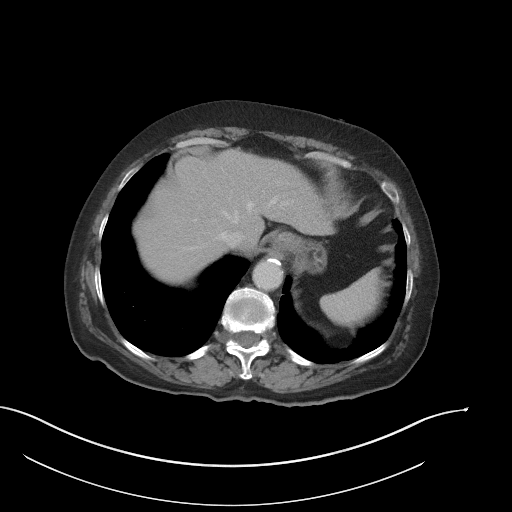
[im 81/87  soft-tissue]
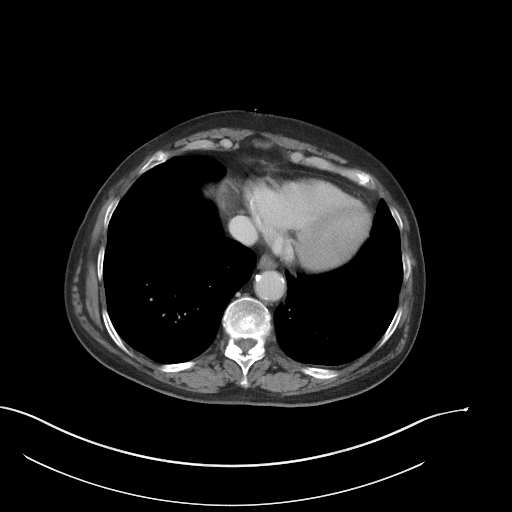

[Series 6: coronal st · coronal · 0.81mm/px · 3 of 101 slices shown]
[im 34/101  soft-tissue]
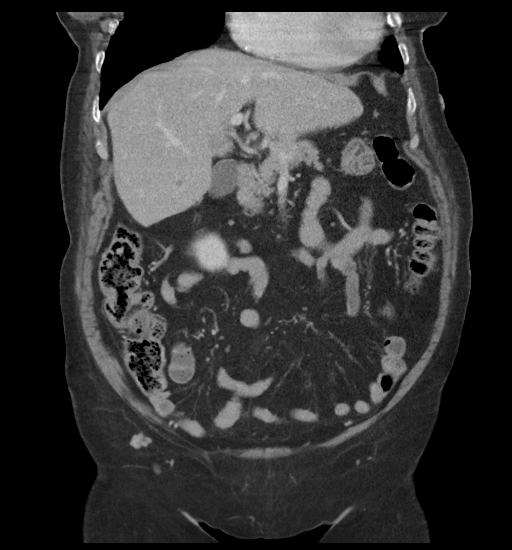
[im 45/101  soft-tissue]
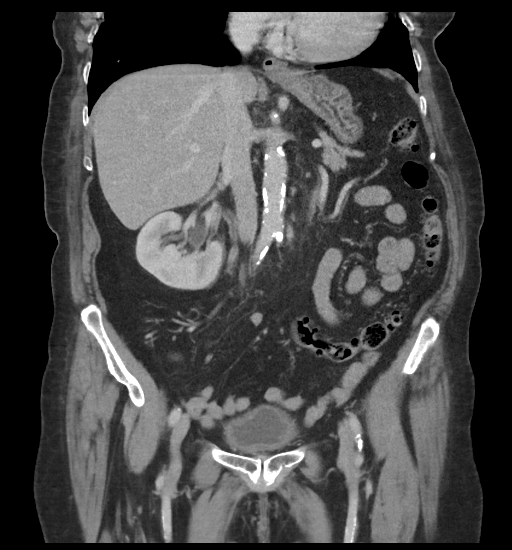
[im 56/101  soft-tissue]
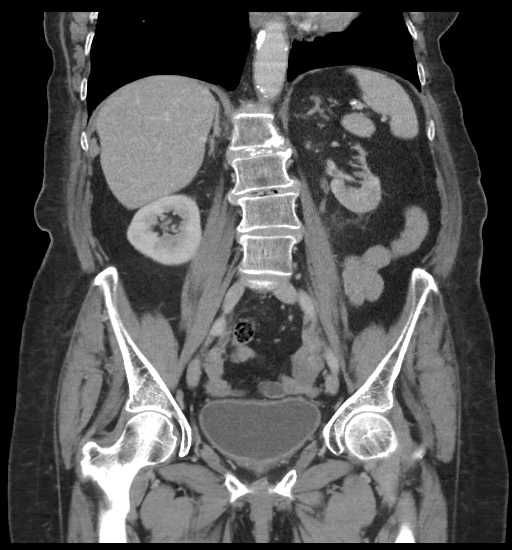

[15 of 46 positions shown; findings below may reference images not displayed]

RADIATION DOSE REDUCTION: This exam was performed according to the
departmental dose-optimization program which includes automated
exposure control, adjustment of the mA and/or kV according to
patient size and/or use of iterative reconstruction technique.

CONTRAST:  100mL OMNIPAQUE IOHEXOL 300 MG/ML  SOLN
FINDINGS: Lower chest: No acute airspace disease or pleural effusion. Tiny
hiatal hernia.

Hepatobiliary: Mm hypodensity in the right lobe of the liver, series
2, image 29, too small to characterize but likely small cyst or
hemangioma. There is focal fatty infiltration adjacent to the
falciform ligament. No suspicious liver lesion. Gallbladder
physiologically distended, no calcified stone. No biliary
dilatation.

Pancreas: No ductal dilatation or inflammation.

Spleen: Normal in size without focal abnormality.

Adrenals/Urinary Tract: No adrenal nodule. There is marked atrophy
of the upper and mid left kidney, lower pole cortex appears in
thickness. Compensatory hypertrophy of the right kidney. There is
heterogeneous enhancement of the lower pole of the left kidney. Mild
urothelial thickening and enhancement about both ureters, more so on
the right and distally, for example series 2, image 62. Tiny
subcentimeter hypodense lesions in the lower right kidney are too
small to characterize but likely small cysts. There is no
perinephric collection. No dedicated follow-up is recommended. Mild
wall thickening and enhancement of the urinary bladder. No
visualized urolithiasis.

Stomach/Bowel: Small hiatal hernia. The stomach is decompressed.
There is no small bowel obstruction or inflammation. Appendix is not
confidently visualized. Colonic diverticulosis without
diverticulitis. The transverse colon is redundant. No colonic
inflammation.

Vascular/Lymphatic: Moderate aortic atherosclerosis. Suspect
high-grade stenosis of the left renal artery. Patent portal,
splenic, and mesenteric veins. No abdominopelvic adenopathy.

Reproductive: Hysterectomy. No adnexal mass. Ovaries tentatively
visualized and quiescent.

Other: No free air, free fluid, or intra-abdominal fluid collection.

Musculoskeletal: There are no acute or suspicious osseous
abnormalities. Facet hypertrophy in the lumbar spine.
IMPRESSION: 1. Mild wall thickening and enhancement of the urinary bladder and
ureters, suspicious for cystitis and ascending urinary tract
infection.
2. Heterogeneous enhancement of the lower pole of the left kidney,
suspicious for pyelonephritis.
3. Marked atrophy of the upper and mid left kidney likely due to
renal artery stenosis. There is compensatory hypertrophy of the
right kidney.
4. Colonic diverticulosis without diverticulitis.
5. Small hiatal hernia.

Aortic Atherosclerosis ([id number]).

## 2022-12-22 DIAGNOSIS — R944 Abnormal results of kidney function studies: Secondary | ICD-10-CM | POA: Diagnosis not present

## 2022-12-22 DIAGNOSIS — E7849 Other hyperlipidemia: Secondary | ICD-10-CM | POA: Diagnosis not present

## 2022-12-22 DIAGNOSIS — E559 Vitamin D deficiency, unspecified: Secondary | ICD-10-CM | POA: Diagnosis not present

## 2022-12-22 DIAGNOSIS — E871 Hypo-osmolality and hyponatremia: Secondary | ICD-10-CM | POA: Diagnosis not present

## 2022-12-22 DIAGNOSIS — E876 Hypokalemia: Secondary | ICD-10-CM | POA: Diagnosis not present

## 2022-12-29 DIAGNOSIS — R499 Unspecified voice and resonance disorder: Secondary | ICD-10-CM | POA: Diagnosis not present

## 2022-12-29 DIAGNOSIS — I7121 Aneurysm of the ascending aorta, without rupture: Secondary | ICD-10-CM | POA: Diagnosis not present

## 2022-12-29 DIAGNOSIS — D171 Benign lipomatous neoplasm of skin and subcutaneous tissue of trunk: Secondary | ICD-10-CM | POA: Diagnosis not present

## 2022-12-29 DIAGNOSIS — T485X5A Adverse effect of other anti-common-cold drugs, initial encounter: Secondary | ICD-10-CM | POA: Diagnosis not present

## 2022-12-29 DIAGNOSIS — E876 Hypokalemia: Secondary | ICD-10-CM | POA: Diagnosis not present

## 2022-12-29 DIAGNOSIS — I10 Essential (primary) hypertension: Secondary | ICD-10-CM | POA: Diagnosis not present

## 2022-12-29 DIAGNOSIS — R0982 Postnasal drip: Secondary | ICD-10-CM | POA: Diagnosis not present

## 2022-12-29 DIAGNOSIS — R0981 Nasal congestion: Secondary | ICD-10-CM | POA: Diagnosis not present

## 2022-12-29 DIAGNOSIS — E7849 Other hyperlipidemia: Secondary | ICD-10-CM | POA: Diagnosis not present

## 2023-02-10 DIAGNOSIS — H01002 Unspecified blepharitis right lower eyelid: Secondary | ICD-10-CM | POA: Diagnosis not present

## 2023-02-10 DIAGNOSIS — H02831 Dermatochalasis of right upper eyelid: Secondary | ICD-10-CM | POA: Diagnosis not present

## 2023-02-10 DIAGNOSIS — H02834 Dermatochalasis of left upper eyelid: Secondary | ICD-10-CM | POA: Diagnosis not present

## 2023-02-10 DIAGNOSIS — H26492 Other secondary cataract, left eye: Secondary | ICD-10-CM | POA: Diagnosis not present

## 2023-02-10 DIAGNOSIS — H01004 Unspecified blepharitis left upper eyelid: Secondary | ICD-10-CM | POA: Diagnosis not present

## 2023-02-10 DIAGNOSIS — H01001 Unspecified blepharitis right upper eyelid: Secondary | ICD-10-CM | POA: Diagnosis not present

## 2023-02-10 DIAGNOSIS — Z961 Presence of intraocular lens: Secondary | ICD-10-CM | POA: Diagnosis not present

## 2023-02-10 DIAGNOSIS — H01005 Unspecified blepharitis left lower eyelid: Secondary | ICD-10-CM | POA: Diagnosis not present

## 2023-02-24 ENCOUNTER — Other Ambulatory Visit (INDEPENDENT_AMBULATORY_CARE_PROVIDER_SITE_OTHER): Payer: Self-pay

## 2023-02-24 ENCOUNTER — Ambulatory Visit: Payer: 59 | Admitting: Orthopaedic Surgery

## 2023-02-24 ENCOUNTER — Encounter: Payer: Self-pay | Admitting: Orthopaedic Surgery

## 2023-02-24 VITALS — Ht 64.5 in | Wt 185.0 lb

## 2023-02-24 DIAGNOSIS — M67439 Ganglion, unspecified wrist: Secondary | ICD-10-CM | POA: Diagnosis not present

## 2023-02-24 DIAGNOSIS — M25532 Pain in left wrist: Secondary | ICD-10-CM | POA: Diagnosis not present

## 2023-02-24 NOTE — Progress Notes (Signed)
Office Visit Note   Patient: Kelli Rodriguez           Date of Birth: [birth year]           MRN: [id number] Visit Date: 02/24/2023              Requested by: Juliette Alcide, MD [address] [place],  [place] [address] PCP: Juliette Alcide, MD   Assessment & Plan: Visit Diagnoses:  1. Pain in left wrist   2. Palmar wrist ganglion     Plan: She can return if the ganglion gets larger or becomes more painful.  With her history of breast cancer she was principally concerned that she had a mass that was present that had not been identified.  X-rays of her wrist were normal.  If the ganglion enlarges or becomes significantly more painful she can return.  Follow-Up Instructions: No follow-ups on file.   Orders:  Orders Placed This Encounter  Procedures   XR Wrist Complete Left   No orders of the defined types were placed in this encounter.     Procedures: No procedures performed   Clinical Data: No additional findings.   Subjective: Chief Complaint  Patient presents with   Left Wrist - Pain   Left Thumb - Pain    HPI patient previous had surgery right wrist first dorsal compartment back in 2022 doing well.  She noticed left wrist volar swelling adjacent to the flexor carpi radialis next-door watchband comes in her nondominant left wrist.  Sometimes it is bothering her she has noticed it has several small lobules sometimes it is larger than others sometimes aches.  Review of Systems all other systems unchanged.  Does have a history of breast cancer.   Objective: Vital Signs: Ht 5' 4.5" (1.638 m)   Wt 185 lb (83.9 kg)   BMI 31.26 kg/m   Physical Exam Constitutional:      Appearance: She is well-developed.  HENT:     Head: Normocephalic.     Right Ear: External ear normal.     Left Ear: External ear normal. There is no impacted cerumen.  Eyes:     Pupils: Pupils are equal, round, and reactive to light.  Neck:     Thyroid: No thyromegaly.     Trachea: No  tracheal deviation.  Cardiovascular:     Rate and Rhythm: Normal rate.  Pulmonary:     Effort: Pulmonary effort is normal.  Abdominal:     Palpations: Abdomen is soft.  Musculoskeletal:     Cervical back: No rigidity.  Skin:    General: Skin is warm and dry.  Neurological:     Mental Status: She is alert and oriented to person, place, and time.  Psychiatric:        Behavior: Behavior normal.     Ortho Exam small multiloculated volar wrist ganglion adjacent to the FCR.  No snuffbox tenderness no thenar atrophy good range of motion of the wrist.  Palpation the ganglion is not painful.  No pain with resisted FCR function.  Specialty Comments:  No specialty comments available.  Imaging: No results found.   PMFS History: Patient Active Problem List   Diagnosis Date Noted   Palmar wrist ganglion 02/24/2023   Sepsis (HCC) 06/09/2021   Hypokalemia 06/09/2021   Hyponatremia 06/09/2021   Acute respiratory failure with hypoxia (HCC) 06/09/2021   Hypertensive urgency 06/09/2021   Leukocytosis 06/09/2021   Vitamin D deficiency 06/09/2021   Obesity (BMI 30-39.9)  06/09/2021   Pyelonephritis 06/08/2021   Tendinitis, de Quervain's 02/14/2020   Insomnia 12/06/2014   Vitamin B 12 deficiency 12/06/2014   Ascending aortic aneurysm (HCC) 08/24/2011   Malignant neoplasm of female breast (HCC) 07/09/2009   Mixed hyperlipidemia 07/09/2009   BRONCHIECTASIS 07/09/2009   Hemoptysis 07/09/2009   Essential hypertension 07/08/2009   Past Medical History:  Diagnosis Date   Anemia    Aneurysm (HCC) 2014   Aneurysm of aorta (HCC) 2014   Aneurysm, thoracic    Arthritis    knees and hands   Ascending aortic aneurysm (HCC)    followed by Dr. Laneta Simmers, 4.3 cm 08/2011 by [facility]   Blood transfusion without reported diagnosis    Bronchitis, chronic (HCC)    Cancer (HCC)    right breast cancer s/p mastectomy '00   Chronic kidney disease    Constipation due to pain medication    COPD (chronic  obstructive pulmonary disease) (HCC)    uses o2 @ night   Hypertension     Family History  Problem Relation Age of Onset   Cancer Neg Hx    Heart disease Neg Hx     Past Surgical History:  Procedure Laterality Date   ABDOMINAL HYSTERECTOMY     APPENDECTOMY     BLADDER SURGERY     tacked   BREAST SURGERY     BROW LIFT Bilateral 10/31/2014   Procedure: BLEPHAROPLASTY;  Surgeon: Fabio Pierce, MD;  Location: [facility];  Service: Ophthalmology;  Laterality: Bilateral;   EYE SURGERY     bilateral--lens implant left   JOINT REPLACEMENT     KNEE ARTHROSCOPY     right   MASTECTOMY     right    2002   right breast implant     TOTAL KNEE ARTHROPLASTY  02/01/2012   RIGHT KNEE   TOTAL KNEE ARTHROPLASTY  02/01/2012   Procedure: TOTAL KNEE ARTHROPLASTY;  Surgeon: Cammy Copa, MD;  Location: [facility] OR;  Service: Orthopedics;  Laterality: Right;  Right total knee arthroplasty   TOTAL KNEE ARTHROPLASTY Left 08/22/2012   Procedure: TOTAL KNEE ARTHROPLASTY- left;  Surgeon: Cammy Copa, MD;  Location: [facility] OR;  Service: Orthopedics;  Laterality: Left;   TUBAL LIGATION     1968   Social History   Occupational History   Not on file  Tobacco Use   Smoking status: Never   Smokeless tobacco: Never  Substance and Sexual Activity   Alcohol use: No   Drug use: No   Sexual activity: Not on file

## 2023-03-17 DIAGNOSIS — H401133 Primary open-angle glaucoma, bilateral, severe stage: Secondary | ICD-10-CM | POA: Diagnosis not present

## 2023-03-17 DIAGNOSIS — H01002 Unspecified blepharitis right lower eyelid: Secondary | ICD-10-CM | POA: Diagnosis not present

## 2023-03-17 DIAGNOSIS — Z961 Presence of intraocular lens: Secondary | ICD-10-CM | POA: Diagnosis not present

## 2023-03-17 DIAGNOSIS — H26492 Other secondary cataract, left eye: Secondary | ICD-10-CM | POA: Diagnosis not present

## 2023-03-17 DIAGNOSIS — H01001 Unspecified blepharitis right upper eyelid: Secondary | ICD-10-CM | POA: Diagnosis not present

## 2023-03-17 DIAGNOSIS — H02834 Dermatochalasis of left upper eyelid: Secondary | ICD-10-CM | POA: Diagnosis not present

## 2023-03-17 DIAGNOSIS — H02831 Dermatochalasis of right upper eyelid: Secondary | ICD-10-CM | POA: Diagnosis not present

## 2023-03-17 DIAGNOSIS — H01004 Unspecified blepharitis left upper eyelid: Secondary | ICD-10-CM | POA: Diagnosis not present

## 2023-03-17 DIAGNOSIS — H01005 Unspecified blepharitis left lower eyelid: Secondary | ICD-10-CM | POA: Diagnosis not present

## 2023-03-31 DIAGNOSIS — Z961 Presence of intraocular lens: Secondary | ICD-10-CM | POA: Diagnosis not present

## 2023-03-31 DIAGNOSIS — H01002 Unspecified blepharitis right lower eyelid: Secondary | ICD-10-CM | POA: Diagnosis not present

## 2023-03-31 DIAGNOSIS — H01005 Unspecified blepharitis left lower eyelid: Secondary | ICD-10-CM | POA: Diagnosis not present

## 2023-03-31 DIAGNOSIS — H401133 Primary open-angle glaucoma, bilateral, severe stage: Secondary | ICD-10-CM | POA: Diagnosis not present

## 2023-03-31 DIAGNOSIS — H02055 Trichiasis without entropian left lower eyelid: Secondary | ICD-10-CM | POA: Diagnosis not present

## 2023-03-31 DIAGNOSIS — H26492 Other secondary cataract, left eye: Secondary | ICD-10-CM | POA: Diagnosis not present

## 2023-03-31 DIAGNOSIS — H01004 Unspecified blepharitis left upper eyelid: Secondary | ICD-10-CM | POA: Diagnosis not present

## 2023-03-31 DIAGNOSIS — H02831 Dermatochalasis of right upper eyelid: Secondary | ICD-10-CM | POA: Diagnosis not present

## 2023-03-31 DIAGNOSIS — H01001 Unspecified blepharitis right upper eyelid: Secondary | ICD-10-CM | POA: Diagnosis not present

## 2023-03-31 DIAGNOSIS — H02834 Dermatochalasis of left upper eyelid: Secondary | ICD-10-CM | POA: Diagnosis not present

## 2023-05-09 DIAGNOSIS — H26492 Other secondary cataract, left eye: Secondary | ICD-10-CM | POA: Diagnosis not present

## 2023-05-26 DIAGNOSIS — H524 Presbyopia: Secondary | ICD-10-CM | POA: Diagnosis not present

## 2023-06-15 DIAGNOSIS — H905 Unspecified sensorineural hearing loss: Secondary | ICD-10-CM | POA: Diagnosis not present

## 2023-07-25 DIAGNOSIS — L820 Inflamed seborrheic keratosis: Secondary | ICD-10-CM | POA: Diagnosis not present

## 2023-07-25 DIAGNOSIS — D0472 Carcinoma in situ of skin of left lower limb, including hip: Secondary | ICD-10-CM | POA: Diagnosis not present

## 2023-07-25 DIAGNOSIS — D485 Neoplasm of uncertain behavior of skin: Secondary | ICD-10-CM | POA: Diagnosis not present

## 2023-07-25 DIAGNOSIS — L988 Other specified disorders of the skin and subcutaneous tissue: Secondary | ICD-10-CM | POA: Diagnosis not present

## 2023-07-25 DIAGNOSIS — L538 Other specified erythematous conditions: Secondary | ICD-10-CM | POA: Diagnosis not present

## 2023-07-25 DIAGNOSIS — L570 Actinic keratosis: Secondary | ICD-10-CM | POA: Diagnosis not present

## 2023-07-25 DIAGNOSIS — L2989 Other pruritus: Secondary | ICD-10-CM | POA: Diagnosis not present

## 2023-07-25 DIAGNOSIS — R208 Other disturbances of skin sensation: Secondary | ICD-10-CM | POA: Diagnosis not present

## 2023-07-25 DIAGNOSIS — L568 Other specified acute skin changes due to ultraviolet radiation: Secondary | ICD-10-CM | POA: Diagnosis not present

## 2023-08-08 DIAGNOSIS — Z131 Encounter for screening for diabetes mellitus: Secondary | ICD-10-CM | POA: Diagnosis not present

## 2023-08-08 DIAGNOSIS — E876 Hypokalemia: Secondary | ICD-10-CM | POA: Diagnosis not present

## 2023-08-08 DIAGNOSIS — E7849 Other hyperlipidemia: Secondary | ICD-10-CM | POA: Diagnosis not present

## 2023-08-08 DIAGNOSIS — D559 Anemia due to enzyme disorder, unspecified: Secondary | ICD-10-CM | POA: Diagnosis not present

## 2023-08-08 DIAGNOSIS — R944 Abnormal results of kidney function studies: Secondary | ICD-10-CM | POA: Diagnosis not present

## 2023-08-11 DIAGNOSIS — H52223 Regular astigmatism, bilateral: Secondary | ICD-10-CM | POA: Diagnosis not present

## 2023-08-11 DIAGNOSIS — H02834 Dermatochalasis of left upper eyelid: Secondary | ICD-10-CM | POA: Diagnosis not present

## 2023-08-11 DIAGNOSIS — H01004 Unspecified blepharitis left upper eyelid: Secondary | ICD-10-CM | POA: Diagnosis not present

## 2023-08-11 DIAGNOSIS — H02831 Dermatochalasis of right upper eyelid: Secondary | ICD-10-CM | POA: Diagnosis not present

## 2023-08-11 DIAGNOSIS — H401112 Primary open-angle glaucoma, right eye, moderate stage: Secondary | ICD-10-CM | POA: Diagnosis not present

## 2023-08-11 DIAGNOSIS — H401123 Primary open-angle glaucoma, left eye, severe stage: Secondary | ICD-10-CM | POA: Diagnosis not present

## 2023-08-11 DIAGNOSIS — H01005 Unspecified blepharitis left lower eyelid: Secondary | ICD-10-CM | POA: Diagnosis not present

## 2023-08-11 DIAGNOSIS — H01002 Unspecified blepharitis right lower eyelid: Secondary | ICD-10-CM | POA: Diagnosis not present

## 2023-08-11 DIAGNOSIS — Z961 Presence of intraocular lens: Secondary | ICD-10-CM | POA: Diagnosis not present

## 2023-08-11 DIAGNOSIS — H01001 Unspecified blepharitis right upper eyelid: Secondary | ICD-10-CM | POA: Diagnosis not present

## 2023-08-12 ENCOUNTER — Other Ambulatory Visit: Payer: Self-pay | Admitting: Family Medicine

## 2023-08-12 DIAGNOSIS — I7121 Aneurysm of the ascending aorta, without rupture: Secondary | ICD-10-CM

## 2023-08-12 DIAGNOSIS — Z1389 Encounter for screening for other disorder: Secondary | ICD-10-CM | POA: Diagnosis not present

## 2023-08-12 DIAGNOSIS — Z23 Encounter for immunization: Secondary | ICD-10-CM | POA: Diagnosis not present

## 2023-08-12 DIAGNOSIS — Z0000 Encounter for general adult medical examination without abnormal findings: Secondary | ICD-10-CM | POA: Diagnosis not present

## 2023-08-12 DIAGNOSIS — H9190 Unspecified hearing loss, unspecified ear: Secondary | ICD-10-CM | POA: Diagnosis not present

## 2023-08-12 DIAGNOSIS — E782 Mixed hyperlipidemia: Secondary | ICD-10-CM | POA: Diagnosis not present

## 2023-08-12 DIAGNOSIS — E876 Hypokalemia: Secondary | ICD-10-CM | POA: Diagnosis not present

## 2023-08-12 DIAGNOSIS — Z0001 Encounter for general adult medical examination with abnormal findings: Secondary | ICD-10-CM | POA: Diagnosis not present

## 2023-08-12 DIAGNOSIS — C4492 Squamous cell carcinoma of skin, unspecified: Secondary | ICD-10-CM | POA: Diagnosis not present

## 2023-08-30 DIAGNOSIS — I071 Rheumatic tricuspid insufficiency: Secondary | ICD-10-CM | POA: Diagnosis not present

## 2023-08-30 DIAGNOSIS — I6523 Occlusion and stenosis of bilateral carotid arteries: Secondary | ICD-10-CM | POA: Diagnosis not present

## 2023-08-30 DIAGNOSIS — I351 Nonrheumatic aortic (valve) insufficiency: Secondary | ICD-10-CM | POA: Diagnosis not present

## 2023-08-30 DIAGNOSIS — I6521 Occlusion and stenosis of right carotid artery: Secondary | ICD-10-CM | POA: Diagnosis not present

## 2023-08-30 DIAGNOSIS — I371 Nonrheumatic pulmonary valve insufficiency: Secondary | ICD-10-CM | POA: Diagnosis not present

## 2023-08-30 DIAGNOSIS — I340 Nonrheumatic mitral (valve) insufficiency: Secondary | ICD-10-CM | POA: Diagnosis not present

## 2023-08-30 DIAGNOSIS — R011 Cardiac murmur, unspecified: Secondary | ICD-10-CM | POA: Diagnosis not present

## 2023-08-31 ENCOUNTER — Inpatient Hospital Stay: Admission: RE | Admit: 2023-08-31 | Source: Ambulatory Visit

## 2023-09-08 DIAGNOSIS — D0472 Carcinoma in situ of skin of left lower limb, including hip: Secondary | ICD-10-CM | POA: Diagnosis not present

## 2023-09-21 ENCOUNTER — Ambulatory Visit
Admission: RE | Admit: 2023-09-21 | Discharge: 2023-09-21 | Disposition: A | Discharge status: Home / Self Care | Source: Ambulatory Visit | Attending: Family Medicine | Admitting: Family Medicine

## 2023-09-21 DIAGNOSIS — I7121 Aneurysm of the ascending aorta, without rupture: Secondary | ICD-10-CM

## 2023-09-21 MED ORDER — GADOPICLENOL 0.5 MMOL/ML IV SOLN
8.0000 mL | Freq: Once | INTRAVENOUS | Status: AC | PRN
  Administered 2023-09-21: 8 mL via INTRAVENOUS

## 2023-09-22 DIAGNOSIS — L568 Other specified acute skin changes due to ultraviolet radiation: Secondary | ICD-10-CM | POA: Diagnosis not present

## 2023-09-26 ENCOUNTER — Other Ambulatory Visit (HOSPITAL_COMMUNITY): Payer: Self-pay | Admitting: Family Medicine

## 2023-09-26 DIAGNOSIS — Z1231 Encounter for screening mammogram for malignant neoplasm of breast: Secondary | ICD-10-CM

## 2023-09-29 ENCOUNTER — Encounter (HOSPITAL_COMMUNITY): Payer: Self-pay

## 2023-09-29 ENCOUNTER — Ambulatory Visit (HOSPITAL_COMMUNITY)
Admission: RE | Admit: 2023-09-29 | Discharge: 2023-09-29 | Disposition: A | Discharge status: Home / Self Care | Source: Ambulatory Visit | Attending: Family Medicine | Admitting: Family Medicine

## 2023-09-29 DIAGNOSIS — Z1231 Encounter for screening mammogram for malignant neoplasm of breast: Secondary | ICD-10-CM | POA: Diagnosis not present

## 2023-10-06 ENCOUNTER — Other Ambulatory Visit (HOSPITAL_COMMUNITY): Payer: Self-pay | Admitting: Family Medicine

## 2023-10-06 DIAGNOSIS — R928 Other abnormal and inconclusive findings on diagnostic imaging of breast: Secondary | ICD-10-CM

## 2023-10-11 ENCOUNTER — Ambulatory Visit (HOSPITAL_COMMUNITY)
Admission: RE | Admit: 2023-10-11 | Discharge: 2023-10-11 | Disposition: A | Discharge status: Home / Self Care | Source: Ambulatory Visit | Attending: Family Medicine | Admitting: Family Medicine

## 2023-10-11 DIAGNOSIS — R928 Other abnormal and inconclusive findings on diagnostic imaging of breast: Secondary | ICD-10-CM

## 2023-10-11 DIAGNOSIS — R92322 Mammographic fibroglandular density, left breast: Secondary | ICD-10-CM | POA: Diagnosis not present

## 2023-10-11 DIAGNOSIS — N6323 Unspecified lump in the left breast, lower outer quadrant: Secondary | ICD-10-CM | POA: Diagnosis not present

## 2023-11-02 DIAGNOSIS — Z48817 Encounter for surgical aftercare following surgery on the skin and subcutaneous tissue: Secondary | ICD-10-CM | POA: Diagnosis not present

## 2023-11-02 DIAGNOSIS — Z961 Presence of intraocular lens: Secondary | ICD-10-CM | POA: Diagnosis not present

## 2023-11-02 DIAGNOSIS — H401123 Primary open-angle glaucoma, left eye, severe stage: Secondary | ICD-10-CM | POA: Diagnosis not present

## 2023-11-02 DIAGNOSIS — H401112 Primary open-angle glaucoma, right eye, moderate stage: Secondary | ICD-10-CM | POA: Diagnosis not present

## 2023-11-07 DIAGNOSIS — I10 Essential (primary) hypertension: Secondary | ICD-10-CM | POA: Diagnosis not present

## 2023-11-07 DIAGNOSIS — Z96653 Presence of artificial knee joint, bilateral: Secondary | ICD-10-CM | POA: Diagnosis not present

## 2023-11-07 DIAGNOSIS — S1989XA Other specified injuries of other specified part of neck, initial encounter: Secondary | ICD-10-CM | POA: Diagnosis not present

## 2023-11-07 DIAGNOSIS — S066X0A Traumatic subarachnoid hemorrhage without loss of consciousness, initial encounter: Secondary | ICD-10-CM | POA: Diagnosis not present

## 2023-11-07 DIAGNOSIS — R102 Pelvic and perineal pain unspecified side: Secondary | ICD-10-CM | POA: Diagnosis not present

## 2023-11-07 DIAGNOSIS — R42 Dizziness and giddiness: Secondary | ICD-10-CM | POA: Diagnosis not present

## 2023-11-07 DIAGNOSIS — M50321 Other cervical disc degeneration at C4-C5 level: Secondary | ICD-10-CM | POA: Diagnosis not present

## 2023-11-07 DIAGNOSIS — G4700 Insomnia, unspecified: Secondary | ICD-10-CM | POA: Diagnosis not present

## 2023-11-07 DIAGNOSIS — Z9011 Acquired absence of right breast and nipple: Secondary | ICD-10-CM | POA: Diagnosis not present

## 2023-11-07 DIAGNOSIS — S15091A Other specified injury of right carotid artery, initial encounter: Secondary | ICD-10-CM | POA: Diagnosis not present

## 2023-11-07 DIAGNOSIS — E785 Hyperlipidemia, unspecified: Secondary | ICD-10-CM | POA: Diagnosis not present

## 2023-11-07 DIAGNOSIS — W010XXA Fall on same level from slipping, tripping and stumbling without subsequent striking against object, initial encounter: Secondary | ICD-10-CM | POA: Diagnosis not present

## 2023-11-07 DIAGNOSIS — R0902 Hypoxemia: Secondary | ICD-10-CM | POA: Diagnosis not present

## 2023-11-07 DIAGNOSIS — R079 Chest pain, unspecified: Secondary | ICD-10-CM | POA: Diagnosis not present

## 2023-11-07 DIAGNOSIS — I2510 Atherosclerotic heart disease of native coronary artery without angina pectoris: Secondary | ICD-10-CM | POA: Diagnosis not present

## 2023-11-07 DIAGNOSIS — S15092A Other specified injury of left carotid artery, initial encounter: Secondary | ICD-10-CM | POA: Diagnosis not present

## 2023-11-07 DIAGNOSIS — I7774 Dissection of vertebral artery: Secondary | ICD-10-CM | POA: Diagnosis not present

## 2023-11-07 DIAGNOSIS — S065XAA Traumatic subdural hemorrhage with loss of consciousness status unknown, initial encounter: Secondary | ICD-10-CM | POA: Diagnosis not present

## 2023-11-07 DIAGNOSIS — S0003XA Contusion of scalp, initial encounter: Secondary | ICD-10-CM | POA: Diagnosis not present

## 2023-11-07 DIAGNOSIS — N390 Urinary tract infection, site not specified: Secondary | ICD-10-CM | POA: Diagnosis not present

## 2023-11-07 DIAGNOSIS — Z743 Need for continuous supervision: Secondary | ICD-10-CM | POA: Diagnosis not present

## 2023-11-07 DIAGNOSIS — M47812 Spondylosis without myelopathy or radiculopathy, cervical region: Secondary | ICD-10-CM | POA: Diagnosis not present

## 2023-11-07 DIAGNOSIS — W19XXXA Unspecified fall, initial encounter: Secondary | ICD-10-CM | POA: Diagnosis not present

## 2023-11-07 DIAGNOSIS — Z853 Personal history of malignant neoplasm of breast: Secondary | ICD-10-CM | POA: Diagnosis not present

## 2023-11-07 DIAGNOSIS — E871 Hypo-osmolality and hyponatremia: Secondary | ICD-10-CM | POA: Diagnosis not present

## 2023-11-07 DIAGNOSIS — S299XXA Unspecified injury of thorax, initial encounter: Secondary | ICD-10-CM | POA: Diagnosis not present

## 2023-11-07 DIAGNOSIS — S065X0A Traumatic subdural hemorrhage without loss of consciousness, initial encounter: Secondary | ICD-10-CM | POA: Diagnosis not present

## 2023-11-07 DIAGNOSIS — J984 Other disorders of lung: Secondary | ICD-10-CM | POA: Diagnosis not present

## 2023-11-07 DIAGNOSIS — R531 Weakness: Secondary | ICD-10-CM | POA: Diagnosis not present

## 2023-11-07 DIAGNOSIS — S15101A Unspecified injury of right vertebral artery, initial encounter: Secondary | ICD-10-CM | POA: Diagnosis not present

## 2023-11-07 DIAGNOSIS — T1490XA Injury, unspecified, initial encounter: Secondary | ICD-10-CM | POA: Diagnosis not present

## 2023-11-13 DIAGNOSIS — R5383 Other fatigue: Secondary | ICD-10-CM | POA: Diagnosis not present

## 2023-11-13 DIAGNOSIS — Z4682 Encounter for fitting and adjustment of non-vascular catheter: Secondary | ICD-10-CM | POA: Diagnosis not present

## 2023-11-13 DIAGNOSIS — S065X0A Traumatic subdural hemorrhage without loss of consciousness, initial encounter: Secondary | ICD-10-CM | POA: Diagnosis not present

## 2023-11-13 DIAGNOSIS — S065XAA Traumatic subdural hemorrhage with loss of consciousness status unknown, initial encounter: Secondary | ICD-10-CM | POA: Diagnosis not present

## 2023-11-13 DIAGNOSIS — R404 Transient alteration of awareness: Secondary | ICD-10-CM | POA: Diagnosis not present

## 2023-11-13 DIAGNOSIS — J984 Other disorders of lung: Secondary | ICD-10-CM | POA: Diagnosis not present

## 2023-11-13 DIAGNOSIS — I6200 Nontraumatic subdural hemorrhage, unspecified: Secondary | ICD-10-CM | POA: Diagnosis not present

## 2023-11-13 DIAGNOSIS — Z743 Need for continuous supervision: Secondary | ICD-10-CM | POA: Diagnosis not present

## 2023-11-13 DIAGNOSIS — R509 Fever, unspecified: Secondary | ICD-10-CM | POA: Diagnosis not present

## 2023-11-13 DIAGNOSIS — I499 Cardiac arrhythmia, unspecified: Secondary | ICD-10-CM | POA: Diagnosis not present

## 2023-11-13 DIAGNOSIS — R000 Tachycardia, unspecified: Secondary | ICD-10-CM | POA: Diagnosis not present

## 2023-11-13 DIAGNOSIS — R918 Other nonspecific abnormal finding of lung field: Secondary | ICD-10-CM | POA: Diagnosis not present

## 2023-11-13 DIAGNOSIS — I160 Hypertensive urgency: Secondary | ICD-10-CM | POA: Diagnosis not present

## 2023-11-14 DIAGNOSIS — I6200 Nontraumatic subdural hemorrhage, unspecified: Secondary | ICD-10-CM | POA: Diagnosis not present

## 2023-11-14 DIAGNOSIS — I359 Nonrheumatic aortic valve disorder, unspecified: Secondary | ICD-10-CM | POA: Diagnosis not present

## 2023-11-14 DIAGNOSIS — I517 Cardiomegaly: Secondary | ICD-10-CM | POA: Diagnosis not present

## 2023-11-14 DIAGNOSIS — I77810 Thoracic aortic ectasia: Secondary | ICD-10-CM | POA: Diagnosis not present

## 2023-11-14 DIAGNOSIS — S065XAA Traumatic subdural hemorrhage with loss of consciousness status unknown, initial encounter: Secondary | ICD-10-CM | POA: Diagnosis not present

## 2023-11-14 DIAGNOSIS — R000 Tachycardia, unspecified: Secondary | ICD-10-CM | POA: Diagnosis not present

## 2023-11-14 DIAGNOSIS — I10 Essential (primary) hypertension: Secondary | ICD-10-CM | POA: Diagnosis not present

## 2023-11-15 DIAGNOSIS — I6200 Nontraumatic subdural hemorrhage, unspecified: Secondary | ICD-10-CM | POA: Diagnosis not present

## 2023-11-15 DIAGNOSIS — R918 Other nonspecific abnormal finding of lung field: Secondary | ICD-10-CM | POA: Diagnosis not present

## 2023-11-15 DIAGNOSIS — Z4682 Encounter for fitting and adjustment of non-vascular catheter: Secondary | ICD-10-CM | POA: Diagnosis not present

## 2023-11-15 DIAGNOSIS — Z452 Encounter for adjustment and management of vascular access device: Secondary | ICD-10-CM | POA: Diagnosis not present

## 2023-11-16 DIAGNOSIS — S065XAA Traumatic subdural hemorrhage with loss of consciousness status unknown, initial encounter: Secondary | ICD-10-CM | POA: Diagnosis not present

## 2023-11-16 DIAGNOSIS — I6201 Nontraumatic acute subdural hemorrhage: Secondary | ICD-10-CM | POA: Diagnosis not present

## 2023-11-18 DIAGNOSIS — I6200 Nontraumatic subdural hemorrhage, unspecified: Secondary | ICD-10-CM | POA: Diagnosis not present

## 2023-11-18 DIAGNOSIS — R569 Unspecified convulsions: Secondary | ICD-10-CM | POA: Diagnosis not present

## 2023-11-19 DIAGNOSIS — S065XAA Traumatic subdural hemorrhage with loss of consciousness status unknown, initial encounter: Secondary | ICD-10-CM | POA: Diagnosis not present

## 2023-11-19 DIAGNOSIS — R569 Unspecified convulsions: Secondary | ICD-10-CM | POA: Diagnosis not present

## 2023-11-19 DIAGNOSIS — G9341 Metabolic encephalopathy: Secondary | ICD-10-CM | POA: Diagnosis not present

## 2023-11-21 DIAGNOSIS — N390 Urinary tract infection, site not specified: Secondary | ICD-10-CM | POA: Diagnosis not present

## 2023-11-21 DIAGNOSIS — A499 Bacterial infection, unspecified: Secondary | ICD-10-CM | POA: Diagnosis not present

## 2023-11-21 DIAGNOSIS — G9341 Metabolic encephalopathy: Secondary | ICD-10-CM | POA: Diagnosis not present

## 2023-11-21 DIAGNOSIS — J9601 Acute respiratory failure with hypoxia: Secondary | ICD-10-CM | POA: Diagnosis not present

## 2023-11-26 DIAGNOSIS — E785 Hyperlipidemia, unspecified: Secondary | ICD-10-CM | POA: Diagnosis not present

## 2023-11-26 DIAGNOSIS — S065XAA Traumatic subdural hemorrhage with loss of consciousness status unknown, initial encounter: Secondary | ICD-10-CM | POA: Diagnosis not present

## 2023-11-26 DIAGNOSIS — G9341 Metabolic encephalopathy: Secondary | ICD-10-CM | POA: Diagnosis not present

## 2023-11-26 DIAGNOSIS — D649 Anemia, unspecified: Secondary | ICD-10-CM | POA: Diagnosis not present

## 2023-11-26 DIAGNOSIS — R1312 Dysphagia, oropharyngeal phase: Secondary | ICD-10-CM | POA: Diagnosis not present

## 2023-11-26 DIAGNOSIS — I10 Essential (primary) hypertension: Secondary | ICD-10-CM | POA: Diagnosis not present

## 2023-11-26 DIAGNOSIS — S065X9S Traumatic subdural hemorrhage with loss of consciousness of unspecified duration, sequela: Secondary | ICD-10-CM | POA: Diagnosis not present

## 2023-11-27 DIAGNOSIS — E785 Hyperlipidemia, unspecified: Secondary | ICD-10-CM | POA: Diagnosis not present

## 2023-11-27 DIAGNOSIS — G9341 Metabolic encephalopathy: Secondary | ICD-10-CM | POA: Diagnosis not present

## 2023-11-27 DIAGNOSIS — D649 Anemia, unspecified: Secondary | ICD-10-CM | POA: Diagnosis not present

## 2023-11-27 DIAGNOSIS — I10 Essential (primary) hypertension: Secondary | ICD-10-CM | POA: Diagnosis not present

## 2023-11-28 DIAGNOSIS — E785 Hyperlipidemia, unspecified: Secondary | ICD-10-CM | POA: Diagnosis not present

## 2023-11-28 DIAGNOSIS — M6281 Muscle weakness (generalized): Secondary | ICD-10-CM | POA: Diagnosis not present

## 2023-11-28 DIAGNOSIS — R2689 Other abnormalities of gait and mobility: Secondary | ICD-10-CM | POA: Diagnosis not present

## 2023-11-28 DIAGNOSIS — I10 Essential (primary) hypertension: Secondary | ICD-10-CM | POA: Diagnosis not present

## 2023-11-29 DIAGNOSIS — I10 Essential (primary) hypertension: Secondary | ICD-10-CM | POA: Diagnosis not present

## 2023-11-29 DIAGNOSIS — E785 Hyperlipidemia, unspecified: Secondary | ICD-10-CM | POA: Diagnosis not present

## 2023-11-29 DIAGNOSIS — R2689 Other abnormalities of gait and mobility: Secondary | ICD-10-CM | POA: Diagnosis not present

## 2023-11-29 DIAGNOSIS — S065XAA Traumatic subdural hemorrhage with loss of consciousness status unknown, initial encounter: Secondary | ICD-10-CM | POA: Diagnosis not present

## 2023-11-29 DIAGNOSIS — R1312 Dysphagia, oropharyngeal phase: Secondary | ICD-10-CM | POA: Diagnosis not present

## 2023-11-29 DIAGNOSIS — M6281 Muscle weakness (generalized): Secondary | ICD-10-CM | POA: Diagnosis not present

## 2023-11-30 DIAGNOSIS — S065XAA Traumatic subdural hemorrhage with loss of consciousness status unknown, initial encounter: Secondary | ICD-10-CM | POA: Diagnosis not present

## 2023-11-30 DIAGNOSIS — E785 Hyperlipidemia, unspecified: Secondary | ICD-10-CM | POA: Diagnosis not present

## 2023-11-30 DIAGNOSIS — I10 Essential (primary) hypertension: Secondary | ICD-10-CM | POA: Diagnosis not present

## 2023-11-30 DIAGNOSIS — R2689 Other abnormalities of gait and mobility: Secondary | ICD-10-CM | POA: Diagnosis not present

## 2023-11-30 DIAGNOSIS — M6281 Muscle weakness (generalized): Secondary | ICD-10-CM | POA: Diagnosis not present

## 2023-11-30 DIAGNOSIS — R1312 Dysphagia, oropharyngeal phase: Secondary | ICD-10-CM | POA: Diagnosis not present

## 2023-12-01 DIAGNOSIS — I10 Essential (primary) hypertension: Secondary | ICD-10-CM | POA: Diagnosis not present

## 2023-12-01 DIAGNOSIS — E785 Hyperlipidemia, unspecified: Secondary | ICD-10-CM | POA: Diagnosis not present

## 2023-12-01 DIAGNOSIS — R2689 Other abnormalities of gait and mobility: Secondary | ICD-10-CM | POA: Diagnosis not present

## 2023-12-01 DIAGNOSIS — M6281 Muscle weakness (generalized): Secondary | ICD-10-CM | POA: Diagnosis not present

## 2023-12-02 DIAGNOSIS — R2689 Other abnormalities of gait and mobility: Secondary | ICD-10-CM | POA: Diagnosis not present

## 2023-12-02 DIAGNOSIS — E785 Hyperlipidemia, unspecified: Secondary | ICD-10-CM | POA: Diagnosis not present

## 2023-12-02 DIAGNOSIS — M6281 Muscle weakness (generalized): Secondary | ICD-10-CM | POA: Diagnosis not present

## 2023-12-02 DIAGNOSIS — I10 Essential (primary) hypertension: Secondary | ICD-10-CM | POA: Diagnosis not present

## 2023-12-03 DIAGNOSIS — I10 Essential (primary) hypertension: Secondary | ICD-10-CM | POA: Diagnosis not present

## 2023-12-03 DIAGNOSIS — M6281 Muscle weakness (generalized): Secondary | ICD-10-CM | POA: Diagnosis not present

## 2023-12-03 DIAGNOSIS — E785 Hyperlipidemia, unspecified: Secondary | ICD-10-CM | POA: Diagnosis not present

## 2023-12-03 DIAGNOSIS — R2689 Other abnormalities of gait and mobility: Secondary | ICD-10-CM | POA: Diagnosis not present

## 2023-12-04 DIAGNOSIS — I10 Essential (primary) hypertension: Secondary | ICD-10-CM | POA: Diagnosis not present

## 2023-12-04 DIAGNOSIS — R2689 Other abnormalities of gait and mobility: Secondary | ICD-10-CM | POA: Diagnosis not present

## 2023-12-04 DIAGNOSIS — E785 Hyperlipidemia, unspecified: Secondary | ICD-10-CM | POA: Diagnosis not present

## 2023-12-04 DIAGNOSIS — M6281 Muscle weakness (generalized): Secondary | ICD-10-CM | POA: Diagnosis not present

## 2023-12-05 DIAGNOSIS — M6281 Muscle weakness (generalized): Secondary | ICD-10-CM | POA: Diagnosis not present

## 2023-12-05 DIAGNOSIS — E785 Hyperlipidemia, unspecified: Secondary | ICD-10-CM | POA: Diagnosis not present

## 2023-12-05 DIAGNOSIS — I10 Essential (primary) hypertension: Secondary | ICD-10-CM | POA: Diagnosis not present

## 2023-12-05 DIAGNOSIS — R2689 Other abnormalities of gait and mobility: Secondary | ICD-10-CM | POA: Diagnosis not present
